# Patient Record
Sex: Female | Born: 1960
Health system: Southern US, Community
[De-identification: ages and names within clinical notes are randomized; demographics above are authoritative.]

## PROBLEM LIST (undated history)

## (undated) DIAGNOSIS — J45909 Unspecified asthma, uncomplicated: Secondary | ICD-10-CM

## (undated) DIAGNOSIS — T7840XA Allergy, unspecified, initial encounter: Secondary | ICD-10-CM

## (undated) HISTORY — DX: Unspecified asthma, uncomplicated: J45.909

## (undated) HISTORY — DX: Allergy, unspecified, initial encounter: T78.40XA

## (undated) HISTORY — PX: HYSTERECTOMY: SHX81

---

## 1999-05-13 HISTORY — PX: ABDOMINAL HYSTERECTOMY: SHX81

## 2014-02-16 ENCOUNTER — Other Ambulatory Visit (INDEPENDENT_AMBULATORY_CARE_PROVIDER_SITE_OTHER): Payer: Self-pay | Admitting: Family

## 2014-02-16 DIAGNOSIS — J328 Other chronic sinusitis: Secondary | ICD-10-CM

## 2014-02-22 ENCOUNTER — Ambulatory Visit (INDEPENDENT_AMBULATORY_CARE_PROVIDER_SITE_OTHER)
Admission: RE | Admit: 2014-02-22 | Discharge: 2014-02-22 | Disposition: A | Discharge status: Home / Self Care | Payer: No Typology Code available for payment source | Source: Ambulatory Visit | Attending: Family | Admitting: Family

## 2014-02-22 DIAGNOSIS — J328 Other chronic sinusitis: Secondary | ICD-10-CM

## 2014-02-27 ENCOUNTER — Other Ambulatory Visit (INDEPENDENT_AMBULATORY_CARE_PROVIDER_SITE_OTHER): Payer: Self-pay | Admitting: Otolaryngology

## 2014-02-27 DIAGNOSIS — J4520 Mild intermittent asthma, uncomplicated: Secondary | ICD-10-CM

## 2014-02-27 DIAGNOSIS — J324 Chronic pansinusitis: Secondary | ICD-10-CM

## 2014-02-27 DIAGNOSIS — J330 Polyp of nasal cavity: Secondary | ICD-10-CM

## 2014-04-10 ENCOUNTER — Ambulatory Visit (INDEPENDENT_AMBULATORY_CARE_PROVIDER_SITE_OTHER)
Admission: RE | Admit: 2014-04-10 | Discharge: 2014-04-10 | Disposition: A | Discharge status: Home / Self Care | Payer: No Typology Code available for payment source | Source: Ambulatory Visit | Attending: Otolaryngology | Admitting: Otolaryngology

## 2014-04-10 ENCOUNTER — Encounter (INDEPENDENT_AMBULATORY_CARE_PROVIDER_SITE_OTHER): Payer: Self-pay

## 2014-04-10 DIAGNOSIS — J324 Chronic pansinusitis: Secondary | ICD-10-CM

## 2014-04-10 DIAGNOSIS — J330 Polyp of nasal cavity: Secondary | ICD-10-CM

## 2014-04-10 DIAGNOSIS — J4520 Mild intermittent asthma, uncomplicated: Secondary | ICD-10-CM

## 2014-05-09 ENCOUNTER — Ambulatory Visit (INDEPENDENT_AMBULATORY_CARE_PROVIDER_SITE_OTHER): Payer: No Typology Code available for payment source | Admitting: Physician Assistant

## 2014-05-09 ENCOUNTER — Encounter (INDEPENDENT_AMBULATORY_CARE_PROVIDER_SITE_OTHER): Payer: Self-pay

## 2014-05-09 VITALS — BP 130/90 | HR 88 | Temp 98.1°F | Resp 16 | Ht 64.0 in | Wt 230.0 lb

## 2014-05-09 DIAGNOSIS — H10022 Other mucopurulent conjunctivitis, left eye: Secondary | ICD-10-CM

## 2014-05-09 DIAGNOSIS — H1032 Unspecified acute conjunctivitis, left eye: Secondary | ICD-10-CM

## 2014-05-09 MED ORDER — POLYMYXIN B-TRIMETHOPRIM 10000-0.1 UNIT/ML-% OP SOLN
1.0000 [drp] | Freq: Four times a day (QID) | OPHTHALMIC | Status: DC
Start: 2014-05-09 — End: 2015-05-01

## 2014-05-09 NOTE — Patient Instructions (Signed)
Conjunctivitis Caused by Infection  Infections are caused by viruses or germs (bacteria). Treatment includes keeping your eyes and hands clean. Your health care provider may prescribe eye drops, and tell you to stay home from work or school if you're contagious. Untreated infections can be serious. It'simportant to see yourprovider for a diagnosis.    Viral infections  A cold, flu, or other virus can spread to your eyes. This causes a watery discharge. Your eyes may burn or itch and get red. Your eyelids may also be puffy and sore.  Treatment  Most viral infections go away on their own. Artificial tears and warm compresses can relieve symptoms. Your provider may also prescribe eye drops. A viral infection can be very contagious and spreads quickly. To prevent this, wash your hands often. Use a separate tissue to wipe each eye. Don't touch your eyes or share bedding or towels.  Bacterial infections  Bacterial infections often occur in one eye. There may be a watery or a thick discharge from the eye. These infections can cause serious damage to your eye if not treated promptly.  Treatment  Your provider may prescribe eye drops or ointment to kill the bacteria. Warm compresses can help keep the eyelids clean. To keep the bacteria from spreading, wash your hands often. Use a separate tissue to wipe each eye. Don't touch your eyes or share bedding or towels.   2000-2015 [employer] [address], [place] [address]. All rights reserved. This information is not intended as a substitute for professional medical care. Always follow your healthcare professional's instructions.

## 2014-05-09 NOTE — Progress Notes (Signed)
Subjective:    Patient ID: Lisa Joseph is a 53 y.o. female.    Eye Problem   The left eye is affected. This is a new problem. The current episode started yesterday. The problem occurs constantly. The problem has been unchanged. There was no injury mechanism. The pain is at a severity of 2/10. The pain is mild. Associated symptoms include an eye discharge (white), eye redness, itching and a recent URI. Pertinent negatives include no blurred vision, double vision, fever, foreign body sensation, nausea, photophobia or vomiting. She has tried commercial eye wash for the symptoms. The treatment provided no relief.       The following portions of the patient's history were reviewed and updated as appropriate: allergies, current medications, past family history, past medical history, past social history, past surgical history and problem list.    Review of Systems   Constitutional: Negative for fever.   Eyes: Positive for discharge (white) and redness. Negative for blurred vision, double vision and photophobia.   Gastrointestinal: Negative for nausea and vomiting.   Skin: Positive for itching.   All other systems reviewed and negative.        Objective:    BP 130/90 mmHg  Pulse 88  Temp(Src) 98.1 F (36.7 C) (Oral)  Resp 16  Ht 1.626 m (5\' 4" )  Wt 104.327 kg (230 lb)  BMI 39.46 kg/m2    Physical Exam      Constitutional: oriented to person, place, and time. Appears well-developed and well-nourished.   HENT:   Head: Normocephalic and atraumatic.   Right Ear: External ear and tympanic membrane are normal.  Left Ear: External ear and tympanic membrane are normal.   Nose: Nose normal without drainage.  Mouth/Throat: Oropharynx is clear and moist without erythema.    Right Eye: Normal conjunctiva without drainage, EOM's intact without nystagmus  Left Eye: Conjunctival injection, white discharge. No uptake of fluorescein dye.  Pulmonary/Chest: Effort normal. Breath sounds are clear bilaterally throughout. Normal heart  rate, rhythm, and heart sounds without murmur.  Musculoskeletal: Normal range of motion without pain.    Neurological: The patient is alert and oriented to person, place, and time.   Skin: Skin is warm and dry.   Psychiatric:  Normal mood and affect.     Assessment and Plan:       Lisa Joseph was seen today for eye problem.    Diagnoses and all orders for this visit:    Acute bacterial conjunctivitis of left eye  Orders:  -     trimethoprim-polymyxin b (POLYTRIM) ophthalmic solution; Place 1 drop into the left eye every 6 (six) hours.  x7 days  Counseled to finish course of antibiotics even if condition resolves prior to duration of treatment, to help prevent recurrence and antibiotic resistance.   Pt education handout as below.  Patient Instructions     Conjunctivitis Caused by Infection  Infections are caused by viruses or germs (bacteria). Treatment includes keeping your eyes and hands clean. Your health care provider may prescribe eye drops, and tell you to stay home from work or school if you're contagious. Untreated infections can be serious. It'simportant to see yourprovider for a diagnosis.    Viral infections  A cold, flu, or other virus can spread to your eyes. This causes a watery discharge. Your eyes may burn or itch and get red. Your eyelids may also be puffy and sore.  Treatment  Most viral infections go away on their own. Artificial tears and warm compresses  can relieve symptoms. Your provider may also prescribe eye drops. A viral infection can be very contagious and spreads quickly. To prevent this, wash your hands often. Use a separate tissue to wipe each eye. Don't touch your eyes or share bedding or towels.  Bacterial infections  Bacterial infections often occur in one eye. There may be a watery or a thick discharge from the eye. These infections can cause serious damage to your eye if not treated promptly.  Treatment  Your provider may prescribe eye drops or ointment to kill the bacteria. Warm  compresses can help keep the eyelids clean. To keep the bacteria from spreading, wash your hands often. Use a separate tissue to wipe each eye. Don't touch your eyes or share bedding or towels.   [address] [employer] [address] [place], [place] [address]. All rights reserved. This information is not intended as a substitute for professional medical care. Always follow your healthcare professional's instructions.            Patient/guardian expressed understanding and agreement with plan of care at time of discharge.   RTC if worsening or persistence.          Kandis Mannan, PA-C  [facility]  05/09/2014  1:41 PM

## 2014-05-12 HISTORY — PX: NASAL SINUS SURGERY: SHX719

## 2014-08-11 HISTORY — PX: SINUS SURGERY: SHX187

## 2014-10-25 ENCOUNTER — Ambulatory Visit (INDEPENDENT_AMBULATORY_CARE_PROVIDER_SITE_OTHER): Payer: BC Managed Care – PPO | Admitting: Orthopaedic Surgery

## 2014-10-25 ENCOUNTER — Encounter (INDEPENDENT_AMBULATORY_CARE_PROVIDER_SITE_OTHER): Payer: Self-pay | Admitting: Orthopaedic Surgery

## 2014-10-25 VITALS — BP 167/91 | HR 83 | Ht 64.0 in | Wt 275.0 lb

## 2014-10-25 DIAGNOSIS — M1711 Unilateral primary osteoarthritis, right knee: Secondary | ICD-10-CM | POA: Insufficient documentation

## 2014-10-25 MED ORDER — BETAMETHASONE SOD PHOS & ACET 6 (3-3) MG/ML IJ SUSP
12.0000 mg | Freq: Once | INTRAMUSCULAR | Status: DC
Start: ? — End: 2014-10-25
  Administered 2014-10-25: 10:00:00 12 mg via INTRA_ARTICULAR

## 2014-10-25 MED ORDER — LIDOCAINE HCL (PF) 1 % IJ SOLN
2.0000 mL | Freq: Once | INTRAMUSCULAR | Status: DC
Start: ? — End: 2014-10-25
  Administered 2014-10-25: 2 mL via INTRA_ARTICULAR

## 2014-10-25 NOTE — Progress Notes (Signed)
Progress Note      Chief Complaint   Patient presents with   . Knee Pain     right       Subjective/HPI Comments:    Patient is a  54 y.o. female who presents with right medial knee pain.  She was walking a lot in [place] in February and has had knee pain since the day after.  She lives in a 3 story townhouse, and has difficulty going up the stairs.  When she sits for long periods, she has to stand for a while before she can put weight on it.      Date of Injury:na  Date of Surgery:na  Date Last Seen:new    Dominant Side:right    Pain level today on a scale of 0-10, with 10 being the highest, is 2, gets 5    Pain Quality:    _x__ Aching    ___ Burning    ___ Shooting    ___ Stabbing    _x__ Other: sharp    Pain Course:       ___ Constant    ___ Intermittent    ___ Fluctuating    ___ Improving    ___ Worsening    Associated Symptoms:        ___ Numbness      ___ Weakness      ___ Decreased Range of Motion      ___ Stiffness      ___ Pain with Activity      ___ Night Pain      ___ Locking and Catching      ___ Other:    Previous Treatment:      ___ Rest      ___ Ice      ___ Heat      ___ Tylenol      ___ Anti-inflammatory medication       ___ Other:    Patient lives with husband  Patient works at [employer]house [employer], at [employer]    Outpatient Prescriptions Marked as Taking for the 10/25/14 encounter (Office Visit) with Berniece Pap, MD   Medication Sig Dispense Refill   . acetaminophen (TYLENOL) 325 MG tablet Take 650 mg by mouth.     Elwin Sleight 200-5 MCG/ACT Aerosol   0       Review of Systems:  Review of Systems   Constitutional: Positive for activity change.   HENT: Negative for hearing loss.    Eyes: Negative for visual disturbance.   Respiratory: Negative for shortness of breath.    Cardiovascular: Negative for chest pain, palpitations and leg swelling.   Gastrointestinal: Negative for abdominal pain and diarrhea.   Genitourinary: Negative for urgency.   Musculoskeletal: Positive for gait problem. Negative for joint  swelling.        Positive for right knee pain   Neurological: Negative for seizures, syncope and headaches.   Psychiatric/Behavioral: Negative for dysphoric mood. The patient is not nervous/anxious.           Information above has been gathered by office staff and reviewed and amended by me as needed.  The following portions of the patient's history were reviewed and updated as appropriate: allergies, current medications, past family history, past medical history, past social history, past surgical history, and problem list.    Objective/ Physical Exam:    BP 167/91 mmHg  Pulse 83  Ht 1.626 m (5\' 4" )  Wt 124.739 kg (275 lb)  BMI 47.18 kg/m2  Patient  is alert and oriented.   The skin at the affected area is intact.    Right Knee Exam   Swelling: None  Effusion: No    Tenderness   The patient is experiencing tenderness in the medial joint line.    Range of Motion   Extension: 0  Flexion:     120    Tests   McMurrays:  Medial - Negative      Lateral - Negative  Lachman:  Anterior - Negative    Posterior - Negative  Drawer:       Anterior - Negative    Posterior - Negative  Varus:  Negative  Valgus: Negative    Comments:  6 valgus  Varus is stable but painful      Test Results:      X ray taken today, 10-25-14, for the right knee, shows a smooth joint with early squarring off of the femur but a decent joint space.  Interestingly, the bilateral views which allow comparison with the opposite knee indicate that the left Nonsymptomatic knee is actually a bit worse than the right    Assessment:    1. Primary osteoarthritis of right knee        Plan:    Does not tolerate NSAIDs, gets respiratory distress    Knee Injection Note    Patient's name and date of birth were verified, site of injection was verified, verified that all of the correct equipment was in the room, and consent was obtained.  A "Time Out" was performed to confirm the above.  The right knee was prepped with betadine.  2 mL of Lidocaine and 12mg  of  Betamethasone was injected in the lateral supra-patellar pouch, with aspiration of a drop of joint fluid to confirm needle placement in the joint under sterile technique, with no complications.    The patient has been informed of all ordered tests and/or consults, if applicable.  All patient concerns and questions have been addressed.    Work Status: working   Follow up:  prn

## 2014-10-25 NOTE — Patient Instructions (Signed)
Reason for blood pressure information below:  Your Blood Pressure Reading Today was Elevated-You should see your primary care physician as soon as possible.    When Is Your Blood Pressure a Concern?    If your blood pressure is high, you need to lower it and keep it under control. Your blood pressure reading has 2 numbers. One or both of these numbers can be too high.    The top number is called the systolic blood pressure. This reading is too high if it is 140 or higher.    The bottom number is called the diastolic blood pressure. It is too high if it is 90 or higher.    You are more likely to have high blood pressure as you get older. This is because your blood vessels become stiffer as you age. When that happens, your blood pressure goes up. High blood pressure can lead to stroke, heart attack, heart failure, kidney disease, and early death.    If you have heart or kidney problems, diabetes, or if you had a stroke, your doctor may want your blood pressure to be even lower than people who do not have these conditions.    Medications for Blood Pressure    Many medicines can help you control your blood pressure. Your health care provider will prescribe the best medicine for you. Your health care provider will also monitor your medicines and make changes if you need them.    Diet, Exercise, and Other Lifestyle Changes    In addition to taking medicine, you can do many things to help control your blood pressure.    Limit the amount of sodium (salt) you eat. Aim for less than 1,500 mg per day. Limit how much alcohol you drink -- 1 drink a day for women, 2 a day for men.    Eat a heart-healthy diet. Include potassium and fiber, and drink plenty of water. Stay at a healthy body weight. Find a weight-loss program to help you, if you need it.    Exercise regularly -- at least 30 minutes a day of moderate aerobic exercise.    Reduce stress. Try to avoid things that cause you stress. You can also try meditation or  yoga.    If you smoke, quit. Find a program that will help you stop.    Your doctor can help you find programs for losing weight, stopping smoking, and exercising. You can also get a referral from your doctor to a dietitian. The dietitian can help you plan a diet that is healthy for you.  Checking Your Blood Pressure     Your doctor may ask you to keep track of your blood pressure at home. Make sure you get a good quality, well-fitting home device. It is best to have one with a cuff for your arm and a digital readout. Practice with your health care provider to make sure you are taking your blood pressure correctly.     It is normal for your blood pressure to be different at different times of the day.    It is usually higher when you are at work. It drops slightly when you are at home. It is usually lowest when you are sleeping.    It is normal for your blood pressure to increase suddenly when you wake up. In people with very high blood pressure, this is when they are most at risk for heart attack and stroke.    Follow-up     Your doctor   will give you a physical exam and check your blood pressure often. With your doctor, establish a goal for your blood pressure.    If you monitor your blood pressure at home, keep a written record. Bring the results to your clinic visit. Your doctor or nurse may ask you these questions. Having a written record will make them easy to answer:    . What was your most recent blood pressure reading?  . What was the blood pressure reading before that one?  . What is the average systolic (top) number and average diastolic (bottom) number?  . Has your blood pressure increased recently?     When to Call the Doctor    Call your doctor if your blood pressure goes well above your normal range.    Also call your doctor if you have any of these symptoms:  . Severe headache  . Irregular heartbeat or pulse  . Chest pain  . Sweating, nausea, or vomiting  . Shortness of breath  . Dizziness or  lightheadedness  . Pain or tingling in the neck, jaw, shoulder, or arms  . Numbness or weakness in your body  . Fainting  . Trouble seeing  . Confusion  . Difficulty speaking  . Other side effects that you think might be from your medicine or your blood pressure   .

## 2015-05-01 ENCOUNTER — Encounter (INDEPENDENT_AMBULATORY_CARE_PROVIDER_SITE_OTHER): Payer: Self-pay | Admitting: Orthopaedic Surgery

## 2015-05-01 ENCOUNTER — Ambulatory Visit (INDEPENDENT_AMBULATORY_CARE_PROVIDER_SITE_OTHER): Payer: BC Managed Care – PPO | Admitting: Orthopaedic Surgery

## 2015-05-01 ENCOUNTER — Telehealth (INDEPENDENT_AMBULATORY_CARE_PROVIDER_SITE_OTHER): Payer: Self-pay | Admitting: Orthopaedic Surgery

## 2015-05-01 VITALS — BP 159/98 | HR 96 | Ht 64.0 in | Wt 276.4 lb

## 2015-05-01 DIAGNOSIS — M1711 Unilateral primary osteoarthritis, right knee: Secondary | ICD-10-CM

## 2015-05-01 MED ORDER — HYALURONAN 30 MG/2ML IX SOSY
1.0000 | PREFILLED_SYRINGE | INTRA_ARTICULAR | Status: DC
Start: ? — End: 2015-05-01

## 2015-05-01 NOTE — Telephone Encounter (Signed)
Pt called back and stated her insurance denied orthovisc. Pt wants to know what her options are

## 2015-05-01 NOTE — Progress Notes (Signed)
Progress Note      Chief Complaint   Patient presents with   . Knee Pain     right       Subjective/HPI Comments:    Patient is a  54 y.o. female who presents with right knee pain.  She had an injection of 2 mL of Lidocaine and 12mg  of Betamethasone was injected in the lateral supra-patellar pouch, right knee on 10/25/14 and states she got relief for approximately two weeks and then the pain returned.  She says the pain is getting worse and she is more painful with a lot of walking.  When she goes from sitting to standing she has to wait a few minutes before she starts to walk.  She has not noticed any swelling.    She went to the grocery store on Saturday and had pain for two days following, she had to take Vicodin for the pain that she had left from sinus surgery.  Today is a better day for the pain she says.     Date of Injury:na  Date of Surgery:na  Date Last Seen:10/25/14    Dominant Side:right        Pain level today on a scale of 0-10, with 10 being the highest, is 3.    Patient lives with husband  Patient works at [employer]house [employer], at [employer] site    Outpatient Prescriptions Marked as Taking for the 05/01/15 encounter (Office Visit) with Berniece Pap, MD   Medication Sig Dispense Refill   . acetaminophen (TYLENOL) 325 MG tablet Take 650 mg by mouth.     . DULERA 200-5 MCG/ACT Aerosol   0   . Mometasone Furo-Formoterol Fum 100-5 MCG/ACT Aerosol Inhale into the lungs.         Review of Systems:  Constitutional: Positive for activity change.   HENT: Negative for hearing loss.    Eyes: Negative for visual disturbance.   Respiratory: Negative for shortness of breath.    Cardiovascular: Negative for chest pain, palpitations and leg swelling.   Gastrointestinal: Negative for abdominal pain and diarrhea.   Genitourinary: Negative for urgency.   Musculoskeletal: Positive for gait problem. Negative for joint swelling.        Positive for right knee pain   Neurological: Negative for seizures, syncope and headaches.    Psychiatric/Behavioral: Negative for dysphoric mood. The patient is not nervous/anxious.        Information above has been gathered by office staff and reviewed and amended by me as needed.  The following portions of the patient's history were reviewed and updated as appropriate: allergies, current medications, past family history, past medical history, past social history, past surgical history, and problem list.    Objective/ Physical Exam:    BP 159/98 mmHg  Pulse 96  Ht 1.626 m (5\' 4" )  Wt 125.374 kg (276 lb 6.4 oz)  BMI 47.42 kg/m2  Patient is alert and oriented.   The skin at the affected area is intact  ROM 0-100  Tender at the medial joint line.      Assessment:    1. Primary osteoarthritis of right knee        Plan:    orthovisc prescribed  The patient has been informed of all ordered tests and/or consults, if applicable.  All patient concerns and questions have been addressed.    Work Status: working   Follow up: when [employer] is available

## 2015-05-01 NOTE — Patient Instructions (Signed)
Reason for blood pressure information below:  Your Blood Pressure Reading Today was Elevated-You should see your primary care physician as soon as possible.    When Is Your Blood Pressure a Concern?    If your blood pressure is high, you need to lower it and keep it under control. Your blood pressure reading has 2 numbers. One or both of these numbers can be too high.    The top number is called the systolic blood pressure. This reading is too high if it is 140 or higher.    The bottom number is called the diastolic blood pressure. It is too high if it is 90 or higher.    You are more likely to have high blood pressure as you get older. This is because your blood vessels become stiffer as you age. When that happens, your blood pressure goes up. High blood pressure can lead to stroke, heart attack, heart failure, kidney disease, and early death.    If you have heart or kidney problems, diabetes, or if you had a stroke, your doctor may want your blood pressure to be even lower than people who do not have these conditions.    Medications for Blood Pressure    Many medicines can help you control your blood pressure. Your health care provider will prescribe the best medicine for you. Your health care provider will also monitor your medicines and make changes if you need them.    Diet, Exercise, and Other Lifestyle Changes    In addition to taking medicine, you can do many things to help control your blood pressure.    Limit the amount of sodium (salt) you eat. Aim for less than 1,500 mg per day. Limit how much alcohol you drink -- 1 drink a day for women, 2 a day for men.    Eat a heart-healthy diet. Include potassium and fiber, and drink plenty of water. Stay at a healthy body weight. Find a weight-loss program to help you, if you need it.    Exercise regularly -- at least 30 minutes a day of moderate aerobic exercise.    Reduce stress. Try to avoid things that cause you stress. You can also try meditation or  yoga.    If you smoke, quit. Find a program that will help you stop.    Your doctor can help you find programs for losing weight, stopping smoking, and exercising. You can also get a referral from your doctor to a dietitian. The dietitian can help you plan a diet that is healthy for you.  Checking Your Blood Pressure     Your doctor may ask you to keep track of your blood pressure at home. Make sure you get a good quality, well-fitting home device. It is best to have one with a cuff for your arm and a digital readout. Practice with your health care provider to make sure you are taking your blood pressure correctly.     It is normal for your blood pressure to be different at different times of the day.    It is usually higher when you are at work. It drops slightly when you are at home. It is usually lowest when you are sleeping.    It is normal for your blood pressure to increase suddenly when you wake up. In people with very high blood pressure, this is when they are most at risk for heart attack and stroke.    Follow-up     Your doctor   will give you a physical exam and check your blood pressure often. With your doctor, establish a goal for your blood pressure.    If you monitor your blood pressure at home, keep a written record. Bring the results to your clinic visit. Your doctor or nurse may ask you these questions. Having a written record will make them easy to answer:    . What was your most recent blood pressure reading?  . What was the blood pressure reading before that one?  . What is the average systolic (top) number and average diastolic (bottom) number?  . Has your blood pressure increased recently?     When to Call the Doctor    Call your doctor if your blood pressure goes well above your normal range.    Also call your doctor if you have any of these symptoms:  . Severe headache  . Irregular heartbeat or pulse  . Chest pain  . Sweating, nausea, or vomiting  . Shortness of breath  . Dizziness or  lightheadedness  . Pain or tingling in the neck, jaw, shoulder, or arms  . Numbness or weakness in your body  . Fainting  . Trouble seeing  . Confusion  . Difficulty speaking  . Other side effects that you think might be from your medicine or your blood pressure   .

## 2015-05-02 NOTE — Telephone Encounter (Signed)
Therapy might yield temporary relief.  I will order it if she wants to try it.

## 2015-05-03 NOTE — Telephone Encounter (Signed)
lmom for pt to call back

## 2015-05-03 NOTE — Telephone Encounter (Signed)
Pt states that she would like to try therapy and would like to attend downstairs.  After dr zimet write referral, please take downstairs for them to make an appt

## 2015-05-04 NOTE — Telephone Encounter (Signed)
Order taken downstairs

## 2015-05-04 NOTE — Telephone Encounter (Signed)
PT order written

## 2015-05-15 ENCOUNTER — Ambulatory Visit
Admission: RE | Admit: 2015-05-15 | Discharge: 2015-05-15 | Disposition: A | Discharge status: Home / Self Care | Payer: BC Managed Care – PPO | Source: Ambulatory Visit | Attending: Orthopaedic Surgery | Admitting: Orthopaedic Surgery

## 2015-05-15 DIAGNOSIS — M1711 Unilateral primary osteoarthritis, right knee: Secondary | ICD-10-CM | POA: Insufficient documentation

## 2015-06-13 ENCOUNTER — Ambulatory Visit
Admission: RE | Admit: 2015-06-13 | Discharge: 2015-06-13 | Disposition: A | Discharge status: Home / Self Care | Payer: BC Managed Care – PPO | Source: Ambulatory Visit | Attending: Orthopaedic Surgery | Admitting: Orthopaedic Surgery

## 2015-06-13 DIAGNOSIS — M1711 Unilateral primary osteoarthritis, right knee: Secondary | ICD-10-CM | POA: Insufficient documentation

## 2015-07-11 ENCOUNTER — Ambulatory Visit: Payer: BC Managed Care – PPO

## 2015-10-22 ENCOUNTER — Encounter (INDEPENDENT_AMBULATORY_CARE_PROVIDER_SITE_OTHER): Payer: Self-pay | Admitting: Family Medicine

## 2015-10-22 ENCOUNTER — Ambulatory Visit (INDEPENDENT_AMBULATORY_CARE_PROVIDER_SITE_OTHER): Payer: BC Managed Care – PPO | Admitting: Family Medicine

## 2015-10-22 VITALS — BP 148/82 | HR 95 | Temp 97.8°F | Resp 16 | Ht 64.0 in | Wt 270.0 lb

## 2015-10-22 DIAGNOSIS — J0140 Acute pansinusitis, unspecified: Secondary | ICD-10-CM

## 2015-10-22 MED ORDER — DOXYCYCLINE HYCLATE 100 MG PO CAPS
100.0000 mg | ORAL_CAPSULE | Freq: Two times a day (BID) | ORAL | Status: AC
Start: 2015-10-22 — End: 2015-10-29

## 2015-10-22 NOTE — Progress Notes (Signed)
Subjective:    Patient ID: Lisa Joseph is a 55 y.o. female.    Sinus Problem  This is a new problem. The current episode started in the past 7 days (10 days ). The problem has been gradually worsening since onset. There has been no fever. The pain is moderate. Associated symptoms include chills, congestion, coughing, ear pain, headaches, sinus pressure and a sore throat. Past treatments include acetaminophen. The treatment provided mild relief.       The following portions of the patient's history were reviewed and updated as appropriate: allergies, current medications, past medical history, past social history, past surgical history and problem list.    Review of Systems   Constitutional: Positive for chills.   HENT: Positive for congestion, ear pain, sinus pressure and sore throat.    Respiratory: Positive for cough.    Neurological: Positive for headaches.   All other systems reviewed and are negative.        Objective:    BP 148/82 mmHg  Pulse 95  Temp(Src) 97.8 F (36.6 C) (Oral)  Resp 16  Ht 1.626 m (5\' 4" )  Wt 122.471 kg (270 lb)  BMI 46.32 kg/m2    BP elevated; reviewed. No indication for urgent management.    Physical Exam   Constitutional: She is oriented to person, place, and time. She appears well-developed and well-nourished. No distress.   HENT:   Head: Normocephalic and atraumatic.   Right Ear: Tympanic membrane, external ear and ear canal normal.   Left Ear: Tympanic membrane, external ear and ear canal normal.   Nose: Mucosal edema present.   Mouth/Throat: Uvula is midline, oropharynx is clear and moist and mucous membranes are normal.   Eyes: Conjunctivae and EOM are normal.   Neck: Normal range of motion. Neck supple.   Cardiovascular: Normal rate and regular rhythm.    Pulmonary/Chest: Effort normal and breath sounds normal. No respiratory distress. She has no wheezes. She has no rales.   Musculoskeletal: Normal range of motion.   Lymphadenopathy:     She has no cervical adenopathy.    Neurological: She is alert and oriented to person, place, and time.   Skin: Skin is warm and dry. She is not diaphoretic.   Psychiatric: She has a normal mood and affect.   Nursing note and vitals reviewed.        Assessment and Plan:       [name] was seen today for sinus problem.    Diagnoses and all orders for this visit:    Acute non-recurrent pansinusitis  -     doxycycline (VIBRAMYCIN) 100 MG capsule; Take 1 capsule (100 mg total) by mouth 2 (two) times daily. (allergy to pens)   Advised rest and fluids; discussed appropriate otc sx tx for use prn.   Follow up with PCP or RTC if there are any new or worsening symptoms or if the symptoms are lasting longer than expected.  Patient/guardian expressed understanding and agreement with plan of care at time of discharge.               Isaiah Blakes, MD  [facility]  10/22/2015  2:39 PM

## 2015-10-22 NOTE — Patient Instructions (Signed)
Sinusitis (Antibiotic Treatment)    The sinuses are air-filled spaces within the bones of the face. They connect to the inside of the nose.Sinusitisis an inflammation of the tissue lining the sinus cavity. Sinus inflammation can occur during a cold. It can also be due to allergies to pollens and other particles in the air. Sinusitis can cause symptoms of sinus congestion and fullness. A sinus infection causes fever, headache and facial pain. There is often green or yellow drainage from the nose or into the back of the throat (post-nasal drip). You have been given antibiotics to treat this condition.  Home care:   Take the full course of antibiotics as instructed. Do not stop taking them, even if you feel better.   Drink plenty of water, hot tea, and other liquids. This may help thin mucus. It also may promote sinus drainage.   Heat may help soothe painful areas of the face. Use a towel soaked in hot water. Or, stand in the shower and direct the hot spray onto your face. Using a vaporizer along with a menthol rub at night may also help.   Anexpectorantcontaining guaifenesin may help thin the mucus and promote drainage from the sinuses.   Over-the-counterdecongestantsmay be used unless a similar medicine was prescribed. Nasal sprays work the fastest. Use one that contains phenylephrine or oxymetazoline. First blow the nose gently. Then use the spray. Do not use these medicines more often than directed on the label or symptoms may get worse. You may also use tablets containing pseudoephedrine. Avoid products that combine ingredients, because side effects may be increased. Read labels. You can also ask the pharmacist for help. (NOTE:Persons with high blood pressure should not use decongestants. They can raise blood pressure.)   Over-the-counterantihistaminesmay help if allergies contributed to your sinusitis.    Do not use nasal rinses or irrigation during an acute sinus infection, unless told to by  your health care provider. Rinsing may spread the infection to other sinuses.   Use acetaminophen or ibuprofen to control pain, unless another pain medicine was prescribed. (If you have chronic liver or kidney disease or ever had a stomach ulcer, talk with your doctor before using these medicines. Aspirin should never be used in anyone under 18 years of age who is ill with a fever. It may cause severe liver damage.)   Don't smoke. This can worsen symptoms.  Follow-up care  Follow up with your healthcare provider or our staff if you are not improving within the next week.  When to seek medical advice  Call your healthcare provider if any of these occur:   Facial pain or headache becoming more severe   Stiff neck   Unusual drowsiness or confusion   Swelling of the forehead or eyelids   Vision problems, including blurred or double vision   Fever of100.4F (38C)or higher, or as directed by your healthcare provider   Seizure   Breathing problems   Symptoms not resolving within 10 days  Date Last Reviewed: 08/22/2013   2000-2016 [employer] [address], [place] [address]. All rights reserved. This information is not intended as a substitute for professional medical care. Always follow your healthcare professional's instructions.

## 2015-10-25 ENCOUNTER — Telehealth (INDEPENDENT_AMBULATORY_CARE_PROVIDER_SITE_OTHER): Payer: Self-pay

## 2015-10-25 NOTE — Telephone Encounter (Signed)
Called to check on patient after recent visit.    Feeling much better

## 2016-05-19 ENCOUNTER — Encounter: Payer: Self-pay | Admitting: Internal Medicine

## 2016-05-19 ENCOUNTER — Ambulatory Visit (INDEPENDENT_AMBULATORY_CARE_PROVIDER_SITE_OTHER): Payer: BLUE CROSS/BLUE SHIELD | Admitting: Internal Medicine

## 2016-05-19 VITALS — BP 128/92 | HR 79 | Temp 97.9°F | Ht 63.5 in | Wt 284.8 lb

## 2016-05-19 DIAGNOSIS — I10 Essential (primary) hypertension: Secondary | ICD-10-CM | POA: Diagnosis not present

## 2016-05-19 DIAGNOSIS — J4530 Mild persistent asthma, uncomplicated: Secondary | ICD-10-CM | POA: Diagnosis not present

## 2016-05-19 MED ORDER — LOSARTAN POTASSIUM 25 MG PO TABS
25.0000 mg | ORAL_TABLET | Freq: Every day | ORAL | 0 refills | Status: DC
Start: 2016-05-19 — End: 2016-06-03

## 2016-05-19 MED ORDER — DULERA 200-5 MCG/ACT IN AERO: 2.0000 | INHALATION_SPRAY | Freq: Two times a day (BID) | RESPIRATORY_TRACT | 1 refills | Status: DC

## 2016-05-19 NOTE — Assessment & Plan Note (Signed)
Controlled on Dulera Refilled today

## 2016-05-19 NOTE — Patient Instructions (Signed)
Hypertension Hypertension, commonly called high blood pressure, is when the force of blood pumping through your arteries is too strong. Your arteries are the blood vessels that carry blood from your heart throughout your body. A blood pressure reading consists of a higher number over a lower number, such as 110/72. The higher number (systolic) is the pressure inside your arteries when your heart pumps. The lower number (diastolic) is the pressure inside your arteries when your heart relaxes. Ideally you want your blood pressure below 120/80. Hypertension forces your heart to work harder to pump blood. Your arteries may become narrow or stiff. Having untreated or uncontrolled hypertension can cause heart attack, stroke, kidney disease, and other problems. What increases the risk? Some risk factors for high blood pressure are controllable. Others are not. Risk factors you cannot control include:  Race. You may be at higher risk if you are African American.  Age. Risk increases with age.  Gender. Men are at higher risk than women before age 45 years. After age 65, women are at higher risk than men. Risk factors you can control include:  Not getting enough exercise or physical activity.  Being overweight.  Getting too much fat, sugar, calories, or salt in your diet.  Drinking too much alcohol. What are the signs or symptoms? Hypertension does not usually cause signs or symptoms. Extremely high blood pressure (hypertensive crisis) may cause headache, anxiety, shortness of breath, and nosebleed. How is this diagnosed? To check if you have hypertension, your health care provider will measure your blood pressure while you are seated, with your arm held at the level of your heart. It should be measured at least twice using the same arm. Certain conditions can cause a difference in blood pressure between your right and left arms. A blood pressure reading that is higher than normal on one occasion does  not mean that you need treatment. If it is not clear whether you have high blood pressure, you may be asked to return on a different day to have your blood pressure checked again. Or, you may be asked to monitor your blood pressure at home for 1 or more weeks. How is this treated? Treating high blood pressure includes making lifestyle changes and possibly taking medicine. Living a healthy lifestyle can help lower high blood pressure. You may need to change some of your habits. Lifestyle changes may include:  Following the DASH diet. This diet is high in fruits, vegetables, and whole grains. It is low in salt, red meat, and added sugars.  Keep your sodium intake below 2,300 mg per day.  Getting at least 30-45 minutes of aerobic exercise at least 4 times per week.  Losing weight if necessary.  Not smoking.  Limiting alcoholic beverages.  Learning ways to reduce stress. Your health care provider may prescribe medicine if lifestyle changes are not enough to get your blood pressure under control, and if one of the following is true:  You are 18-59 years of age and your systolic blood pressure is above 140.  You are 60 years of age or older, and your systolic blood pressure is above 150.  Your diastolic blood pressure is above 90.  You have diabetes, and your systolic blood pressure is over 140 or your diastolic blood pressure is over 90.  You have kidney disease and your blood pressure is above 140/90.  You have heart disease and your blood pressure is above 140/90. Your personal target blood pressure may vary depending on your medical   conditions, your age, and other factors. Follow these instructions at home:  Have your blood pressure rechecked as directed by your health care provider.  Take medicines only as directed by your health care provider. Follow the directions carefully. Blood pressure medicines must be taken as prescribed. The medicine does not work as well when you skip  doses. Skipping doses also puts you at risk for problems.  Do not smoke.  Monitor your blood pressure at home as directed by your health care provider. Contact a health care provider if:  You think you are having a reaction to medicines taken.  You have recurrent headaches or feel dizzy.  You have swelling in your ankles.  You have trouble with your vision. Get help right away if:  You develop a severe headache or confusion.  You have unusual weakness, numbness, or feel faint.  You have severe chest or abdominal pain.  You vomit repeatedly.  You have trouble breathing. This information is not intended to replace advice given to you by your health care provider. Make sure you discuss any questions you have with your health care provider. Document Released: 04/28/2005 Document Revised: 10/04/2015 Document Reviewed: 02/18/2013 Elsevier Interactive Patient Education  2017 Elsevier Inc.  

## 2016-05-19 NOTE — Progress Notes (Signed)
Pre visit review using our clinic review tool, if applicable. No additional management support is needed unless otherwise documented below in the visit note. 

## 2016-05-19 NOTE — Assessment & Plan Note (Signed)
Will start Losartan 25 mg daily Discussed how weight loss could help reduce her blood pressure  RTC in 2 weeks for annual exam/HTN followup

## 2016-05-19 NOTE — Progress Notes (Signed)
HPI  Pt presents to the clinic today to establish care and for management of the conditions listed below. She has not had a PCP in many years.  Seasonal Allergies: Worse in the spring. She reports it is triggered by ragweed. She does not take any antihistamine OTC.  Asthma, Mild Persistent: Triggered by cold weather and smoke. She takes [facility] daily as prescribed. She is requesting a refill of this today.  HTN: She reports her BP normally runs 120-130/90's. She has never been on BP medication before. She denies headaches, dizziness, chest pain or shortness of breath.  Flu: 01/2014 Tetanus: > 10 years ago Pneumovax: never Pap Smear: > 5 years ago, partial hysterectomy around 2003 Mammogram: > 2 years ago Colon Screening: never Vision Screening: annually Dentist: biannually  Past Medical History:  Diagnosis Date  . Allergy   . Asthma     Current Outpatient Prescriptions  Medication Sig Dispense Refill  . DULERA 200-5 MCG/ACT AERO INHALE 1 PUFF TWICE A DAY. **USE REGULARLY AND RINSE MOUTH AFTER USE**  1   No current facility-administered medications for this visit.     Allergies  Allergen Reactions  . Penicillins     Other reaction(s): Respiratory Distress  . Aspirin     Other reaction(s): Respiratory Distress    Family History  Problem Relation Age of Onset  . Arthritis Mother   . Hypertension Mother   . Breast cancer Sister   . Arthritis Paternal Grandmother   . Breast cancer Paternal Grandmother   . Hypertension Paternal Grandmother     Social History   Social History  . Marital status: Married    Spouse name: N/A  . Number of children: N/A  . Years of education: N/A   Occupational History  . Not on file.   Social History Main Topics  . Smoking status: Never Smoker  . Smokeless tobacco: Never Used  . Alcohol use Yes     Comment: rare  . Drug use:   . Sexual activity: Not on file   Other Topics Concern  . Not on file   Social History Narrative  .  No narrative on file    ROS:  Constitutional: Denies fever, malaise, fatigue, headache or abrupt weight changes.  HEENT: Denies eye pain, eye redness, ear pain, ringing in the ears, wax buildup, runny nose, nasal congestion, bloody nose, or sore throat. Respiratory: Denies difficulty breathing, shortness of breath, cough or sputum production.   Cardiovascular: Denies chest pain, chest tightness, palpitations or swelling in the hands or feet.   Neurological: Denies dizziness, difficulty with memory, difficulty with speech or problems with balance and coordination.    No other specific complaints in a complete review of systems (except as listed in HPI above).  PE:  BP (!) 128/92   Pulse 79   Temp 97.9 F (36.6 C) (Oral)   Ht 5' 3.5" (1.613 m)   Wt 284 lb 12 oz (129.2 kg)   SpO2 98%   BMI 49.65 kg/m  Wt Readings from Last 3 Encounters:  05/19/16 284 lb 12 oz (129.2 kg)    General: Appears her stated age, obese in NAD. HEENT: Head: normal shape and size; Eyes: sclera white, no icterus, conjunctiva pink, PERRLA and EOMs intact; Ears: Tm's gray and intact, normal light reflex;Throat/Mouth: Teeth present, mucosa pink and moist, no lesions or ulcerations noted.  Cardiovascular: Normal rate and rhythm. S1,S2 noted.  No murmur, rubs or gallops noted.  Pulmonary/Chest: Normal effort and positive vesicular breath sounds.  No respiratory distress. No wheezes, rales or ronchi noted.  Neurological: Alert and oriented.  Psychiatric: Mood and affect normal. Behavior is normal. Judgment and thought content normal.   Assessment and Plan:

## 2016-05-22 ENCOUNTER — Telehealth: Payer: Self-pay

## 2016-05-22 MED ORDER — DULERA 200-5 MCG/ACT IN AERO: 1.0000 | INHALATION_SPRAY | Freq: Two times a day (BID) | RESPIRATORY_TRACT | 1 refills | Status: DC

## 2016-05-22 NOTE — Telephone Encounter (Signed)
Rx sent through e-scribe  

## 2016-05-22 NOTE — Telephone Encounter (Signed)
[facility] [place] [facility]; there are 2 sets of instructions.Please advise.

## 2016-05-22 NOTE — Addendum Note (Signed)
Addended by: Roena MaladyEVONTENNO, Pat Elicker Y on: 05/22/2016 12:23 PM   Modules accepted: Orders

## 2016-05-22 NOTE — Telephone Encounter (Signed)
I puff BID

## 2016-06-02 ENCOUNTER — Encounter: Payer: Self-pay | Admitting: Internal Medicine

## 2016-06-02 ENCOUNTER — Ambulatory Visit (INDEPENDENT_AMBULATORY_CARE_PROVIDER_SITE_OTHER): Payer: BLUE CROSS/BLUE SHIELD | Admitting: Internal Medicine

## 2016-06-02 VITALS — BP 126/82 | HR 81 | Temp 98.4°F | Ht 63.5 in | Wt 286.0 lb

## 2016-06-02 DIAGNOSIS — Z1159 Encounter for screening for other viral diseases: Secondary | ICD-10-CM | POA: Diagnosis not present

## 2016-06-02 DIAGNOSIS — Z114 Encounter for screening for human immunodeficiency virus [HIV]: Secondary | ICD-10-CM | POA: Diagnosis not present

## 2016-06-02 DIAGNOSIS — Z1231 Encounter for screening mammogram for malignant neoplasm of breast: Secondary | ICD-10-CM | POA: Diagnosis not present

## 2016-06-02 DIAGNOSIS — Z23 Encounter for immunization: Secondary | ICD-10-CM | POA: Diagnosis not present

## 2016-06-02 DIAGNOSIS — Z0000 Encounter for general adult medical examination without abnormal findings: Secondary | ICD-10-CM

## 2016-06-02 DIAGNOSIS — Z1239 Encounter for other screening for malignant neoplasm of breast: Secondary | ICD-10-CM

## 2016-06-02 DIAGNOSIS — Z0001 Encounter for general adult medical examination with abnormal findings: Secondary | ICD-10-CM

## 2016-06-02 LAB — CBC
HEMATOCRIT: 44 % (ref 36.0–46.0)
HEMOGLOBIN: 14.5 g/dL (ref 12.0–15.0)
MCHC: 33 g/dL (ref 30.0–36.0)
MCV: 83.2 fl (ref 78.0–100.0)
Platelets: 290 10*3/uL (ref 150.0–400.0)
RBC: 5.29 Mil/uL — ABNORMAL HIGH (ref 3.87–5.11)
RDW: 15.3 % (ref 11.5–15.5)
WBC: 6.4 10*3/uL (ref 4.0–10.5)

## 2016-06-02 LAB — LIPID PANEL
CHOLESTEROL: 227 mg/dL — AB (ref 0–200)
HDL: 51 mg/dL (ref >39.00)
LDL Cholesterol: 156 mg/dL — ABNORMAL HIGH (ref 0–99)
NonHDL: 176.32
TRIGLYCERIDES: 100 mg/dL (ref 0.0–149.0)
Total CHOL/HDL Ratio: 4
VLDL: 20 mg/dL (ref 0.0–40.0)

## 2016-06-02 LAB — COMPREHENSIVE METABOLIC PANEL
ALBUMIN: 4 g/dL (ref 3.5–5.2)
ALK PHOS: 87 U/L (ref 39–117)
ALT: 21 U/L (ref 0–35)
AST: 15 U/L (ref 0–37)
BUN: 10 mg/dL (ref 6–23)
CO2: 28 mEq/L (ref 19–32)
Calcium: 9.6 mg/dL (ref 8.4–10.5)
Chloride: 102 mEq/L (ref 96–112)
Creatinine, Ser: 0.75 mg/dL (ref 0.40–1.20)
GFR: 85.52 mL/min (ref >60.00)
Glucose, Bld: 97 mg/dL (ref 70–99)
POTASSIUM: 4 meq/L (ref 3.5–5.1)
Sodium: 138 mEq/L (ref 135–145)
TOTAL PROTEIN: 8.4 g/dL — AB (ref 6.0–8.3)
Total Bilirubin: 0.7 mg/dL (ref 0.2–1.2)

## 2016-06-02 LAB — TSH: TSH: 3.33 u[IU]/mL (ref 0.35–4.50)

## 2016-06-02 LAB — HEMOGLOBIN A1C: Hgb A1c MFr Bld: 5.8 % (ref 4.6–6.5)

## 2016-06-02 MED ORDER — DULERA 200-5 MCG/ACT IN AERO: 2.0000 | INHALATION_SPRAY | Freq: Two times a day (BID) | RESPIRATORY_TRACT | 1 refills | Status: DC

## 2016-06-02 NOTE — Patient Instructions (Signed)

## 2016-06-02 NOTE — Addendum Note (Signed)
Addended by: Roena MaladyEVONTENNO, Demarus Latterell Y on: 06/02/2016 05:02 PM   Modules accepted: Orders

## 2016-06-02 NOTE — Progress Notes (Signed)
Subjective:    Patient ID: Brianna Weaver, female    DOB: 01/02/1961, 56 y.o.   MRN: [id number]  HPI  Pt presents to the clinic today for her annual exam.  Flu: 01/2014 Tetanus: > 10 years ago Pneumovax: never Pap Smear: ? 5 years ago Mammogram: > 2 years ago Colon Screening: never Vision Screening: annually Dentist: biannually  Diet: She does eat meat. She consumes fruits and veggies daily. She does eat some fried foods. She drinks mostly caffeine free Dt. Pepsi. Exercise: None  Review of Systems      Past Medical History:  Diagnosis Date  . Allergy   . Asthma     Current Outpatient Prescriptions  Medication Sig Dispense Refill  . DULERA 200-5 MCG/ACT AERO Inhale 1 puff into the lungs 2 (two) times daily. **USE REGULARLY AND RINSE MOUTH AFTER USE** 1 Inhaler 1  . losartan (COZAAR) 25 MG tablet Take 1 tablet (25 mg total) by mouth daily. 30 tablet 0   No current facility-administered medications for this visit.     Allergies  Allergen Reactions  . Penicillins     Other reaction(s): Respiratory Distress  . Aspirin     Other reaction(s): Respiratory Distress    Family History  Problem Relation Age of Onset  . Arthritis Mother   . Hypertension Mother   . Breast cancer Sister   . Arthritis Paternal Grandmother   . Breast cancer Paternal Grandmother   . Hypertension Paternal Grandmother     Social History   Social History  . Marital status: Married    Spouse name: N/A  . Number of children: N/A  . Years of education: N/A   Occupational History  . Not on file.   Social History Main Topics  . Smoking status: Never Smoker  . Smokeless tobacco: Never Used  . Alcohol use Yes     Comment: rare  . Drug use: No  . Sexual activity: Yes   Other Topics Concern  . Not on file   Social History Narrative  . No narrative on file     Constitutional: Denies fever, malaise, fatigue, headache or abrupt weight changes.  HEENT: Denies eye pain, eye redness,  ear pain, ringing in the ears, wax buildup, runny nose, nasal congestion, bloody nose, or sore throat. Respiratory: Denies difficulty breathing, shortness of breath, cough or sputum production.   Cardiovascular: Denies chest pain, chest tightness, palpitations or swelling in the hands or feet.  Gastrointestinal: Pt reports intermittent relfux. Denies abdominal pain, bloating, constipation, diarrhea or blood in the stool.  GU: Denies urgency, frequency, pain with urination, burning sensation, blood in urine, odor or discharge. Musculoskeletal: Denies decrease in range of motion, difficulty with gait, muscle pain or joint pain and swelling.  Skin: Denies redness, rashes, lesions or ulcercations.  Neurological: Denies dizziness, difficulty with memory, difficulty with speech or problems with balance and coordination.  Psych: Denies anxiety, depression, SI/HI.  No other specific complaints in a complete review of systems (except as listed in HPI above).  Objective:   Physical Exam  BP 126/82   Pulse 81   Temp 98.4 F (36.9 C) (Oral)   Ht 5' 3.5" (1.613 m)   Wt 286 lb (129.7 kg)   SpO2 98%   BMI 49.87 kg/m  Wt Readings from Last 3 Encounters:  06/02/16 286 lb (129.7 kg)  05/19/16 284 lb 12 oz (129.2 kg)    General: Appears herstated age, obese in NAD. Skin: Warm, dry and intact.  HEENT:  Head: normal shape and size; Eyes: sclera white, no icterus, conjunctiva pink, PERRLA and EOMs intact; Ears: Tm's gray and intact, normal light reflex;Throat/Mouth: Teeth present, mucosa pink and moist, no exudate, lesions or ulcerations noted.  Neck:  Neck supple, trachea midline. No masses, lumps or thyromegaly present.  Cardiovascular: Normal rate and rhythm. S1,S2 noted.  No murmur, rubs or gallops noted. No JVD or BLE edema. No carotid bruits noted. Pulmonary/Chest: Normal effort and positive vesicular breath sounds. No respiratory distress. No wheezes, rales or ronchi noted.  Abdomen: Soft and  nontender. Normal bowel sounds. No distention or masses noted. Liver, spleen and kidneys non palpable. Pelvic: Normal female anatomy. No cervix noted. Adnexa non palpable. Musculoskeletal: Normal range of motion. Strength 5/5 BUE/BLE. No difficulty with gait.  Neurological: Alert and oriented. Cranial nerves II-XII grossly intact. Coordination normal.  Psychiatric: Mood and affect normal. Behavior is normal. Judgment and thought content normal.        Assessment & Plan:   Preventative Health Maintenance:  Flu, tdap and pneumovax given today Pelvic exam done today Mammogram ordered- she will call [facility] to schedule- number provided She declines colonoscopy but is agreeable to Cologuard- ordered Encouraged her to consume a balanced diet and exercise regimen Advised her to see an eye doctor and dentist annually Will check CBC, CMET, Lipid, TSH, A1C, HIV and Hep C today  RTC in 1 year, sooner if needed Nicki Reaper, NP

## 2016-06-03 ENCOUNTER — Other Ambulatory Visit: Payer: Self-pay | Admitting: Internal Medicine

## 2016-06-03 ENCOUNTER — Telehealth: Payer: Self-pay

## 2016-06-03 LAB — HEPATITIS C ANTIBODY: HCV AB: NEGATIVE

## 2016-06-03 LAB — HIV ANTIBODY (ROUTINE TESTING W REFLEX): HIV 1&2 Ab, 4th Generation: NONREACTIVE

## 2016-06-03 NOTE — Telephone Encounter (Signed)
cologuard order has been faxed along with copy of insurance demographics

## 2016-06-04 ENCOUNTER — Other Ambulatory Visit: Payer: Self-pay | Admitting: Internal Medicine

## 2016-06-04 DIAGNOSIS — E781 Pure hyperglyceridemia: Secondary | ICD-10-CM

## 2016-06-04 MED ORDER — LOSARTAN POTASSIUM 25 MG PO TABS: 25.0000 mg | ORAL_TABLET | Freq: Every day | ORAL | 11 refills | Status: DC

## 2016-07-04 ENCOUNTER — Ambulatory Visit
Admission: RE | Admit: 2016-07-04 | Discharge: 2016-07-04 | Disposition: A | Discharge status: Home / Self Care | Payer: BLUE CROSS/BLUE SHIELD | Source: Ambulatory Visit | Attending: Internal Medicine | Admitting: Internal Medicine

## 2016-07-04 DIAGNOSIS — Z1231 Encounter for screening mammogram for malignant neoplasm of breast: Secondary | ICD-10-CM | POA: Diagnosis present

## 2016-07-04 DIAGNOSIS — Z1239 Encounter for other screening for malignant neoplasm of breast: Secondary | ICD-10-CM

## 2016-07-05 LAB — COLOGUARD: COLOGUARD: NEGATIVE

## 2016-07-11 ENCOUNTER — Encounter: Payer: Self-pay | Admitting: Internal Medicine

## 2016-07-14 ENCOUNTER — Telehealth: Payer: Self-pay | Admitting: Internal Medicine

## 2016-07-14 ENCOUNTER — Encounter: Payer: Self-pay | Admitting: Internal Medicine

## 2016-07-14 NOTE — Telephone Encounter (Signed)
Mel, Is this in your inbox?

## 2016-07-14 NOTE — Telephone Encounter (Signed)
Patient called to get the results of the Cologuard test.

## 2016-07-14 NOTE — Telephone Encounter (Signed)
We have not received results yet, it was resulted 07/11/16.... Went online and printed result it was negative.... Pt is aware of negative cologuard test

## 2016-07-27 ENCOUNTER — Other Ambulatory Visit: Payer: Self-pay | Admitting: Internal Medicine

## 2016-09-03 ENCOUNTER — Other Ambulatory Visit (INDEPENDENT_AMBULATORY_CARE_PROVIDER_SITE_OTHER): Payer: BLUE CROSS/BLUE SHIELD

## 2016-09-03 DIAGNOSIS — E781 Pure hyperglyceridemia: Secondary | ICD-10-CM | POA: Diagnosis not present

## 2016-09-03 LAB — LIPID PANEL
Cholesterol: 215 mg/dL — ABNORMAL HIGH (ref 0–200)
HDL: 45.8 mg/dL (ref >39.00)
LDL CALC: 149 mg/dL — AB (ref 0–99)
NonHDL: 169.34
Total CHOL/HDL Ratio: 5
Triglycerides: 101 mg/dL (ref 0.0–149.0)
VLDL: 20.2 mg/dL (ref 0.0–40.0)

## 2016-09-09 MED ORDER — SIMVASTATIN 10 MG PO TABS: 10.0000 mg | ORAL_TABLET | Freq: Every day | ORAL | 2 refills | Status: DC

## 2016-09-09 NOTE — Addendum Note (Signed)
Addended by: Roena Malady on: 09/09/2016 02:39 PM   Modules accepted: Orders

## 2016-10-07 ENCOUNTER — Encounter: Payer: Self-pay | Admitting: Internal Medicine

## 2016-10-07 MED ORDER — ATORVASTATIN CALCIUM 10 MG PO TABS: 10.0000 mg | ORAL_TABLET | Freq: Every day | ORAL | 2 refills | Status: DC

## 2016-10-24 ENCOUNTER — Encounter: Payer: Self-pay | Admitting: Internal Medicine

## 2016-10-24 ENCOUNTER — Ambulatory Visit (INDEPENDENT_AMBULATORY_CARE_PROVIDER_SITE_OTHER): Payer: BLUE CROSS/BLUE SHIELD | Admitting: Internal Medicine

## 2016-10-24 VITALS — BP 128/84 | HR 90 | Temp 98.0°F | Wt 278.8 lb

## 2016-10-24 DIAGNOSIS — S46819A Strain of other muscles, fascia and tendons at shoulder and upper arm level, unspecified arm, initial encounter: Secondary | ICD-10-CM | POA: Diagnosis not present

## 2016-10-24 DIAGNOSIS — W19XXXA Unspecified fall, initial encounter: Secondary | ICD-10-CM

## 2016-10-24 NOTE — Patient Instructions (Signed)

## 2016-10-24 NOTE — Progress Notes (Signed)
Subjective:    Patient ID: Brianna Weaver, female    DOB: 07/11/1960, 56 y.o.   MRN: [id number]  HPI  Pt presents to the clinic today with c/o bilateral shoulder, left arm and wrist pain. This started after a fall yesterday while she was walking in her drive way. She hit her face, but tried to catch herself with her arms. She hit her nose and it bled, but she was able to get it to stop. She describes the pain as sore and achy. It seems worse with movement. She denies numbness or tingling. She has tried Tylenol with minimal relief.   Review of Systems      Past Medical History:  Diagnosis Date  . Allergy   . Asthma     Current Outpatient Prescriptions  Medication Sig Dispense Refill  . atorvastatin (LIPITOR) 10 MG tablet Take 1 tablet (10 mg total) by mouth daily. 30 tablet 2  . DULERA 200-5 MCG/ACT AERO Inhale 2 puffs into the lungs 2 (two) times daily. 1 Inhaler 1  . DULERA 200-5 MCG/ACT AERO INHALE 1 PUFF INTO THE LUNGS 2 (TWO) TIMES DAILY. **USE REGULARLY AND RINSE MOUTH AFTER USE** 13 g 10  . losartan (COZAAR) 25 MG tablet Take 1 tablet (25 mg total) by mouth daily. 30 tablet 11   No current facility-administered medications for this visit.     Allergies  Allergen Reactions  . Penicillins     Other reaction(s): Respiratory Distress  . Aspirin     Other reaction(s): Respiratory Distress    Family History  Problem Relation Age of Onset  . Arthritis Mother   . Hypertension Mother   . Breast cancer Sister [id number]  . Arthritis Paternal Grandmother   . Breast cancer Paternal Grandmother [id number]  . Hypertension Paternal Grandmother     Social History   Social History  . Marital status: Married    Spouse name: N/A  . Number of children: N/A  . Years of education: N/A   Occupational History  . Not on file.   Social History Main Topics  . Smoking status: Never Smoker  . Smokeless tobacco: Never Used  . Alcohol use Yes     Comment: rare  . Drug use: No  . Sexual  activity: Yes   Other Topics Concern  . Not on file   Social History Narrative  . No narrative on file     Constitutional: Denies fever, malaise, fatigue, headache or abrupt weight changes.  Respiratory: Denies difficulty breathing, shortness of breath, cough or sputum production.   Cardiovascular: Denies chest pain, chest tightness, palpitations or swelling in the hands or feet.  Musculoskeletal: Pt reports left shoulder, arm and wrist pain. Denies difficulty with gait, or joint swelling.   No other specific complaints in a complete review of systems (except as listed in HPI above).  Objective:   Physical Exam   BP 128/84   Pulse 90   Temp 98 F (36.7 C) (Oral)   Wt 278 lb 12 oz (126.4 kg)   SpO2 98%   BMI 48.60 kg/m  Wt Readings from Last 3 Encounters:  10/24/16 278 lb 12 oz (126.4 kg)  06/02/16 286 lb (129.7 kg)  05/19/16 284 lb 12 oz (129.2 kg)    General: Appears her stated age, obese in NAD. Skin: Abrasion noted to the left side of her nose. Musculoskeletal: Normal internal and external rotation of bilateral shoulders. Normal flexion, extension and rotation of the cervical spine. No pain with palpation  of the shoulder joint or clavicle. Tightness noted with palpation of the trapezius. Negative drop can test bilaterally. Normal flexion, extension and rotation of the left wrist. Strength 5/5 BUE.  Neurological: Alert and oriented.   BMET    Component Value Date/Time   NA 138 06/02/2016 1417   K 4.0 06/02/2016 1417   CL 102 06/02/2016 1417   CO2 28 06/02/2016 1417   GLUCOSE 97 06/02/2016 1417   BUN 10 06/02/2016 1417   CREATININE 0.75 06/02/2016 1417   CALCIUM 9.6 06/02/2016 1417    Lipid Panel     Component Value Date/Time   CHOL 215 (H) 09/03/2016 0744   TRIG 101.0 09/03/2016 0744   HDL 45.80 09/03/2016 0744   CHOLHDL 5 09/03/2016 0744   VLDL 20.2 09/03/2016 0744   LDLCALC 149 (H) 09/03/2016 0744    CBC    Component Value Date/Time   WBC 6.4  06/02/2016 1417   RBC 5.29 (H) 06/02/2016 1417   HGB 14.5 06/02/2016 1417   HCT 44.0 06/02/2016 1417   PLT 290.0 06/02/2016 1417   MCV 83.2 06/02/2016 1417   MCHC 33.0 06/02/2016 1417   RDW 15.3 06/02/2016 1417    Hgb A1C Lab Results  Component Value Date   HGBA1C 5.8 06/02/2016           Assessment & Plan:   Trapezius Muscle Strain, s/p Fall:  Advised her to try Ibuprofen instead of Tylenol Heat may be helpful Massage would be helpful Back exercises given She declines RX for muscle relaxer at this time  Return precautions discussed Nicki Reaper, NP

## 2016-12-10 ENCOUNTER — Other Ambulatory Visit (INDEPENDENT_AMBULATORY_CARE_PROVIDER_SITE_OTHER): Payer: BLUE CROSS/BLUE SHIELD

## 2016-12-10 DIAGNOSIS — E781 Pure hyperglyceridemia: Secondary | ICD-10-CM | POA: Diagnosis not present

## 2016-12-10 LAB — LIPID PANEL
CHOL/HDL RATIO: 3
CHOLESTEROL: 122 mg/dL (ref 0–200)
HDL: 39.5 mg/dL (ref >39.00)
LDL CALC: 67 mg/dL (ref 0–99)
NonHDL: 82.74
TRIGLYCERIDES: 78 mg/dL (ref 0.0–149.0)
VLDL: 15.6 mg/dL (ref 0.0–40.0)

## 2016-12-10 LAB — COMPREHENSIVE METABOLIC PANEL
ALBUMIN: 3.7 g/dL (ref 3.5–5.2)
ALT: 19 U/L (ref 0–35)
AST: 15 U/L (ref 0–37)
Alkaline Phosphatase: 77 U/L (ref 39–117)
BUN: 9 mg/dL (ref 6–23)
CALCIUM: 9 mg/dL (ref 8.4–10.5)
CHLORIDE: 104 meq/L (ref 96–112)
CO2: 30 mEq/L (ref 19–32)
Creatinine, Ser: 0.83 mg/dL (ref 0.40–1.20)
GFR: 75.93 mL/min (ref >60.00)
Glucose, Bld: 111 mg/dL — ABNORMAL HIGH (ref 70–99)
POTASSIUM: 4.3 meq/L (ref 3.5–5.1)
SODIUM: 140 meq/L (ref 135–145)
Total Bilirubin: 0.4 mg/dL (ref 0.2–1.2)
Total Protein: 7.4 g/dL (ref 6.0–8.3)

## 2017-01-03 ENCOUNTER — Other Ambulatory Visit: Payer: Self-pay | Admitting: Internal Medicine

## 2017-01-08 ENCOUNTER — Other Ambulatory Visit: Payer: Self-pay | Admitting: *Deleted

## 2017-01-08 MED ORDER — ATORVASTATIN CALCIUM 10 MG PO TABS: 10.0000 mg | ORAL_TABLET | Freq: Every day | ORAL | 3 refills | Status: DC

## 2017-01-08 NOTE — Telephone Encounter (Signed)
Received call from pt that the pharmacy said they didn't receive her Lipitor, I resent it

## 2017-01-09 ENCOUNTER — Other Ambulatory Visit: Payer: Self-pay

## 2017-01-09 MED ORDER — ATORVASTATIN CALCIUM 10 MG PO TABS: 10.0000 mg | ORAL_TABLET | Freq: Every day | ORAL | 0 refills | Status: DC

## 2017-05-19 DIAGNOSIS — M546 Pain in thoracic spine: Secondary | ICD-10-CM | POA: Diagnosis not present

## 2017-05-19 DIAGNOSIS — M9901 Segmental and somatic dysfunction of cervical region: Secondary | ICD-10-CM | POA: Diagnosis not present

## 2017-05-19 DIAGNOSIS — M9902 Segmental and somatic dysfunction of thoracic region: Secondary | ICD-10-CM | POA: Diagnosis not present

## 2017-05-19 DIAGNOSIS — M542 Cervicalgia: Secondary | ICD-10-CM | POA: Diagnosis not present

## 2017-05-20 DIAGNOSIS — M9901 Segmental and somatic dysfunction of cervical region: Secondary | ICD-10-CM | POA: Diagnosis not present

## 2017-05-20 DIAGNOSIS — M546 Pain in thoracic spine: Secondary | ICD-10-CM | POA: Diagnosis not present

## 2017-05-20 DIAGNOSIS — M542 Cervicalgia: Secondary | ICD-10-CM | POA: Diagnosis not present

## 2017-05-20 DIAGNOSIS — M9902 Segmental and somatic dysfunction of thoracic region: Secondary | ICD-10-CM | POA: Diagnosis not present

## 2017-05-21 DIAGNOSIS — M542 Cervicalgia: Secondary | ICD-10-CM | POA: Diagnosis not present

## 2017-05-21 DIAGNOSIS — M9902 Segmental and somatic dysfunction of thoracic region: Secondary | ICD-10-CM | POA: Diagnosis not present

## 2017-05-21 DIAGNOSIS — M9901 Segmental and somatic dysfunction of cervical region: Secondary | ICD-10-CM | POA: Diagnosis not present

## 2017-05-21 DIAGNOSIS — M546 Pain in thoracic spine: Secondary | ICD-10-CM | POA: Diagnosis not present

## 2017-05-25 DIAGNOSIS — M9901 Segmental and somatic dysfunction of cervical region: Secondary | ICD-10-CM | POA: Diagnosis not present

## 2017-05-25 DIAGNOSIS — M9902 Segmental and somatic dysfunction of thoracic region: Secondary | ICD-10-CM | POA: Diagnosis not present

## 2017-05-25 DIAGNOSIS — M546 Pain in thoracic spine: Secondary | ICD-10-CM | POA: Diagnosis not present

## 2017-05-25 DIAGNOSIS — M542 Cervicalgia: Secondary | ICD-10-CM | POA: Diagnosis not present

## 2017-05-27 DIAGNOSIS — M9902 Segmental and somatic dysfunction of thoracic region: Secondary | ICD-10-CM | POA: Diagnosis not present

## 2017-05-27 DIAGNOSIS — M546 Pain in thoracic spine: Secondary | ICD-10-CM | POA: Diagnosis not present

## 2017-05-27 DIAGNOSIS — M9901 Segmental and somatic dysfunction of cervical region: Secondary | ICD-10-CM | POA: Diagnosis not present

## 2017-05-27 DIAGNOSIS — M542 Cervicalgia: Secondary | ICD-10-CM | POA: Diagnosis not present

## 2017-05-28 DIAGNOSIS — M9901 Segmental and somatic dysfunction of cervical region: Secondary | ICD-10-CM | POA: Diagnosis not present

## 2017-05-28 DIAGNOSIS — M542 Cervicalgia: Secondary | ICD-10-CM | POA: Diagnosis not present

## 2017-05-28 DIAGNOSIS — M9902 Segmental and somatic dysfunction of thoracic region: Secondary | ICD-10-CM | POA: Diagnosis not present

## 2017-05-28 DIAGNOSIS — M546 Pain in thoracic spine: Secondary | ICD-10-CM | POA: Diagnosis not present

## 2017-06-01 DIAGNOSIS — M546 Pain in thoracic spine: Secondary | ICD-10-CM | POA: Diagnosis not present

## 2017-06-01 DIAGNOSIS — M542 Cervicalgia: Secondary | ICD-10-CM | POA: Diagnosis not present

## 2017-06-01 DIAGNOSIS — M9901 Segmental and somatic dysfunction of cervical region: Secondary | ICD-10-CM | POA: Diagnosis not present

## 2017-06-01 DIAGNOSIS — M9902 Segmental and somatic dysfunction of thoracic region: Secondary | ICD-10-CM | POA: Diagnosis not present

## 2017-06-03 ENCOUNTER — Other Ambulatory Visit: Payer: Self-pay | Admitting: Internal Medicine

## 2017-06-03 DIAGNOSIS — M9901 Segmental and somatic dysfunction of cervical region: Secondary | ICD-10-CM | POA: Diagnosis not present

## 2017-06-03 DIAGNOSIS — M546 Pain in thoracic spine: Secondary | ICD-10-CM | POA: Diagnosis not present

## 2017-06-03 DIAGNOSIS — M9902 Segmental and somatic dysfunction of thoracic region: Secondary | ICD-10-CM | POA: Diagnosis not present

## 2017-06-03 DIAGNOSIS — M542 Cervicalgia: Secondary | ICD-10-CM | POA: Diagnosis not present

## 2017-06-04 DIAGNOSIS — M9901 Segmental and somatic dysfunction of cervical region: Secondary | ICD-10-CM | POA: Diagnosis not present

## 2017-06-04 DIAGNOSIS — M546 Pain in thoracic spine: Secondary | ICD-10-CM | POA: Diagnosis not present

## 2017-06-04 DIAGNOSIS — M542 Cervicalgia: Secondary | ICD-10-CM | POA: Diagnosis not present

## 2017-06-04 DIAGNOSIS — M9902 Segmental and somatic dysfunction of thoracic region: Secondary | ICD-10-CM | POA: Diagnosis not present

## 2017-06-08 DIAGNOSIS — M546 Pain in thoracic spine: Secondary | ICD-10-CM | POA: Diagnosis not present

## 2017-06-08 DIAGNOSIS — M542 Cervicalgia: Secondary | ICD-10-CM | POA: Diagnosis not present

## 2017-06-08 DIAGNOSIS — M9902 Segmental and somatic dysfunction of thoracic region: Secondary | ICD-10-CM | POA: Diagnosis not present

## 2017-06-08 DIAGNOSIS — M9901 Segmental and somatic dysfunction of cervical region: Secondary | ICD-10-CM | POA: Diagnosis not present

## 2017-06-10 DIAGNOSIS — M9901 Segmental and somatic dysfunction of cervical region: Secondary | ICD-10-CM | POA: Diagnosis not present

## 2017-06-10 DIAGNOSIS — M546 Pain in thoracic spine: Secondary | ICD-10-CM | POA: Diagnosis not present

## 2017-06-10 DIAGNOSIS — M542 Cervicalgia: Secondary | ICD-10-CM | POA: Diagnosis not present

## 2017-06-10 DIAGNOSIS — M9902 Segmental and somatic dysfunction of thoracic region: Secondary | ICD-10-CM | POA: Diagnosis not present

## 2017-06-11 DIAGNOSIS — M9902 Segmental and somatic dysfunction of thoracic region: Secondary | ICD-10-CM | POA: Diagnosis not present

## 2017-06-11 DIAGNOSIS — M542 Cervicalgia: Secondary | ICD-10-CM | POA: Diagnosis not present

## 2017-06-11 DIAGNOSIS — M546 Pain in thoracic spine: Secondary | ICD-10-CM | POA: Diagnosis not present

## 2017-06-11 DIAGNOSIS — M9901 Segmental and somatic dysfunction of cervical region: Secondary | ICD-10-CM | POA: Diagnosis not present

## 2017-06-15 DIAGNOSIS — M546 Pain in thoracic spine: Secondary | ICD-10-CM | POA: Diagnosis not present

## 2017-06-15 DIAGNOSIS — M9901 Segmental and somatic dysfunction of cervical region: Secondary | ICD-10-CM | POA: Diagnosis not present

## 2017-06-15 DIAGNOSIS — M9902 Segmental and somatic dysfunction of thoracic region: Secondary | ICD-10-CM | POA: Diagnosis not present

## 2017-06-15 DIAGNOSIS — M542 Cervicalgia: Secondary | ICD-10-CM | POA: Diagnosis not present

## 2017-06-24 ENCOUNTER — Other Ambulatory Visit: Payer: Self-pay | Admitting: Internal Medicine

## 2017-06-24 DIAGNOSIS — Z1231 Encounter for screening mammogram for malignant neoplasm of breast: Secondary | ICD-10-CM

## 2017-06-30 DIAGNOSIS — H35033 Hypertensive retinopathy, bilateral: Secondary | ICD-10-CM | POA: Diagnosis not present

## 2017-07-02 ENCOUNTER — Other Ambulatory Visit: Payer: Self-pay | Admitting: Internal Medicine

## 2017-07-06 ENCOUNTER — Telehealth: Payer: Self-pay | Admitting: Internal Medicine

## 2017-07-06 NOTE — Telephone Encounter (Signed)
Copied from CRM [phone]. Topic: Quick Communication - Rx Refill/Question >> Jul 06, 2017  3:17 PM Rudi CocoLathan, Clessie Karras M, [place] wrote: Medication:  losartan (COZAAR) 25 MG tablet [[id number]][[id number]]   Has the patient contacted their pharmacy? yes   (Agent: If no, request that the patient contact the pharmacy for the refill.)   Preferred Pharmacy (with phone number or street name): [id number] [place] [place] - [address] [place] [id number] Phone: [phone] Fax: [phone]     Agent: Please be advised that RX refills may take up to 3 business days. We ask that you follow-up with your pharmacy.

## 2017-07-07 ENCOUNTER — Ambulatory Visit
Admission: RE | Admit: 2017-07-07 | Discharge: 2017-07-07 | Disposition: A | Discharge status: Home / Self Care | Payer: BLUE CROSS/BLUE SHIELD | Source: Ambulatory Visit | Attending: Internal Medicine | Admitting: Internal Medicine

## 2017-07-07 DIAGNOSIS — Z1231 Encounter for screening mammogram for malignant neoplasm of breast: Secondary | ICD-10-CM | POA: Diagnosis not present

## 2017-07-23 ENCOUNTER — Encounter: Payer: Self-pay | Admitting: Internal Medicine

## 2017-07-23 ENCOUNTER — Encounter: Payer: BLUE CROSS/BLUE SHIELD | Admitting: Internal Medicine

## 2017-07-23 ENCOUNTER — Ambulatory Visit (INDEPENDENT_AMBULATORY_CARE_PROVIDER_SITE_OTHER): Payer: BLUE CROSS/BLUE SHIELD | Admitting: Internal Medicine

## 2017-07-23 VITALS — BP 126/88 | HR 74 | Temp 97.9°F | Ht 63.5 in | Wt 282.0 lb

## 2017-07-23 DIAGNOSIS — I10 Essential (primary) hypertension: Secondary | ICD-10-CM | POA: Diagnosis not present

## 2017-07-23 DIAGNOSIS — Z23 Encounter for immunization: Secondary | ICD-10-CM

## 2017-07-23 DIAGNOSIS — J4530 Mild persistent asthma, uncomplicated: Secondary | ICD-10-CM | POA: Diagnosis not present

## 2017-07-23 DIAGNOSIS — Z0001 Encounter for general adult medical examination with abnormal findings: Secondary | ICD-10-CM

## 2017-07-23 DIAGNOSIS — E7800 Pure hypercholesterolemia, unspecified: Secondary | ICD-10-CM

## 2017-07-23 NOTE — Progress Notes (Signed)
Subjective:    Patient ID: Brianna Weaver, female    DOB: 01-25-1961, 57 y.o.   MRN: [id number]  HPI  Pt presents to the clinic today for her annual exam. She is also due to follow up chronic conditions.  Asthma: Mild, persistent. Controlled on Dulera  HTN: Her BP today is 126/88. She is taking Losartan as prescribed. ECG from reviewed.  HLD: Her last LDL was 67, 12/2016. She is taking Atorvastatin as prescribed. She denies myalgias.  Flu: 05/2016 Tetanus: 05/2016 Pneumovax: 05/2016 Pap Smear: hysterectomy Mammogram: 06/2017 Colon Screening: 06/2016 Vision Screening: annually Dentist: biannually  Diet: She does eat meat. She consumes fruits and veggies daily. She tries to avoid fried foods. She drinks mostly water. Exercise: None  Review of Systems      Past Medical History:  Diagnosis Date  . Allergy   . Asthma     Current Outpatient Medications  Medication Sig Dispense Refill  . atorvastatin (LIPITOR) 10 MG tablet Take 1 tablet (10 mg total) by mouth daily. 90 tablet 0  . DULERA 200-5 MCG/ACT AERO INHALE 1 PUFF INTO THE LUNGS 2 (TWO) TIMES DAILY. **USE REGULARLY AND RINSE MOUTH AFTER USE** 13 g 10  . losartan (COZAAR) 25 MG tablet TAKE 1 TABLET (25 MG TOTAL) BY MOUTH DAILY. MUST SCHEDULE PHYSICAL EXAM 30 tablet 0   No current facility-administered medications for this visit.     Allergies  Allergen Reactions  . Penicillins     Other reaction(s): Respiratory Distress  . Aspirin     Other reaction(s): Respiratory Distress    Family History  Problem Relation Age of Onset  . Arthritis Mother   . Hypertension Mother   . Breast cancer Sister 90  . Arthritis Paternal Grandmother   . Breast cancer Paternal Grandmother 74  . Hypertension Paternal Grandmother     Social History   Socioeconomic History  . Marital status: Married    Spouse name: Not on file  . Number of children: Not on file  . Years of education: Not on file  . Highest education level: Not on  file  Social Needs  . Financial resource strain: Not on file  . Food insecurity - worry: Not on file  . Food insecurity - inability: Not on file  . Transportation needs - medical: Not on file  . Transportation needs - non-medical: Not on file  Occupational History  . Not on file  Tobacco Use  . Smoking status: Never Smoker  . Smokeless tobacco: Never Used  Substance and Sexual Activity  . Alcohol use: Yes    Comment: rare  . Drug use: No  . Sexual activity: Yes  Other Topics Concern  . Not on file  Social History Narrative  . Not on file     Constitutional: Denies fever, malaise, fatigue, headache or abrupt weight changes.  HEENT: Denies eye pain, eye redness, ear pain, ringing in the ears, wax buildup, runny nose, nasal congestion, bloody nose, or sore throat. Respiratory: Denies difficulty breathing, shortness of breath, cough or sputum production.   Cardiovascular: Denies chest pain, chest tightness, palpitations or swelling in the hands or feet.  Gastrointestinal: Denies abdominal pain, bloating, constipation, diarrhea or blood in the stool.  GU: Denies urgency, frequency, pain with urination, burning sensation, blood in urine, odor or discharge. Musculoskeletal: Denies decrease in range of motion, difficulty with gait, muscle pain or joint pain and swelling.  Skin: Denies redness, rashes, lesions or ulcercations.  Neurological: Denies dizziness, difficulty with memory,  difficulty with speech or problems with balance and coordination.  Psych: Denies anxiety, depression, SI/HI.  No other specific complaints in a complete review of systems (except as listed in HPI above).  Objective:   Physical Exam   BP 126/88   Pulse 74   Temp 97.9 F (36.6 C) (Oral)   Ht 5' 3.5" (1.613 m)   Wt 282 lb (127.9 kg)   SpO2 98%   BMI 49.17 kg/m  Wt Readings from Last 3 Encounters:  07/23/17 282 lb (127.9 kg)  10/24/16 278 lb 12 oz (126.4 kg)  06/02/16 286 lb (129.7 kg)     General: Appears her stated age,obese in NAD. Skin: Warm, dry and intact.  HEENT: Head: normal shape and size; Eyes: sclera white, no icterus, conjunctiva pink, PERRLA and EOMs intact; Ears: Tm's gray and intact, normal light reflex; Throat/Mouth: Teeth present, mucosa pink and moist, no exudate, lesions or ulcerations noted.  Neck:  Neck supple, trachea midline. No masses, lumps or thyromegaly present.  Cardiovascular: Normal rate and rhythm. S1,S2 noted.  No murmur, rubs or gallops noted. No JVD or BLE edema. No carotid bruits noted. Pulmonary/Chest: Normal effort and positive vesicular breath sounds. No respiratory distress. No wheezes, rales or ronchi noted.  Abdomen: Soft and nontender. Normal bowel sounds. No distention or masses noted. Liver, spleen and kidneys non palpable. Musculoskeletal: Strength 5/5 BUE/BLE. No difficulty with gait.  Neurological: Alert and oriented. Cranial nerves II-XII grossly intact. Coordination normal.  Psychiatric: Mood and affect normal. Behavior is normal. Judgment and thought content normal.    BMET    Component Value Date/Time   NA 138 07/23/2017 1601   K 4.4 07/23/2017 1601   CL 102 07/23/2017 1601   CO2 29 07/23/2017 1601   GLUCOSE 92 07/23/2017 1601   BUN 10 07/23/2017 1601   CREATININE 0.74 07/23/2017 1601   CALCIUM 10.0 07/23/2017 1601    Lipid Panel     Component Value Date/Time   CHOL 124 07/23/2017 1601   TRIG 79.0 07/23/2017 1601   HDL 48.40 07/23/2017 1601   CHOLHDL 3 07/23/2017 1601   VLDL 15.8 07/23/2017 1601   LDLCALC 60 07/23/2017 1601    CBC    Component Value Date/Time   WBC 7.3 07/23/2017 1601   RBC 5.32 (H) 07/23/2017 1601   HGB 14.8 07/23/2017 1601   HCT 45.8 07/23/2017 1601   PLT 286.0 07/23/2017 1601   MCV 86.1 07/23/2017 1601   MCHC 32.4 07/23/2017 1601   RDW 14.3 07/23/2017 1601    Hgb A1C Lab Results  Component Value Date   HGBA1C 5.8 06/02/2016           Assessment & Plan:    Preventative Health Maintenance:  Flu shot today Tetanus and pneumovax UTD Mammogram UTD She does not need pap smears Colon screening UTD Encouraged her to consume a balanced diet and exercise regimen Advised her to see an eye doctor and dentist annually Will check CBC, CMET, Lipid and Vit D today  RTC in 1 year, sooner if needed Nicki ReaperBAITY, REGINA, NP

## 2017-07-24 LAB — LIPID PANEL
Cholesterol: 124 mg/dL (ref 0–200)
HDL: 48.4 mg/dL (ref >39.00)
LDL Cholesterol: 60 mg/dL (ref 0–99)
NonHDL: 75.96
Total CHOL/HDL Ratio: 3
Triglycerides: 79 mg/dL (ref 0.0–149.0)
VLDL: 15.8 mg/dL (ref 0.0–40.0)

## 2017-07-24 LAB — CBC
HCT: 45.8 % (ref 36.0–46.0)
Hemoglobin: 14.8 g/dL (ref 12.0–15.0)
MCHC: 32.4 g/dL (ref 30.0–36.0)
MCV: 86.1 fl (ref 78.0–100.0)
Platelets: 286 10*3/uL (ref 150.0–400.0)
RBC: 5.32 Mil/uL — AB (ref 3.87–5.11)
RDW: 14.3 % (ref 11.5–15.5)
WBC: 7.3 10*3/uL (ref 4.0–10.5)

## 2017-07-24 LAB — COMPREHENSIVE METABOLIC PANEL
ALBUMIN: 4.4 g/dL (ref 3.5–5.2)
ALK PHOS: 93 U/L (ref 39–117)
ALT: 23 U/L (ref 0–35)
AST: 15 U/L (ref 0–37)
BUN: 10 mg/dL (ref 6–23)
CO2: 29 mEq/L (ref 19–32)
CREATININE: 0.74 mg/dL (ref 0.40–1.20)
Calcium: 10 mg/dL (ref 8.4–10.5)
Chloride: 102 mEq/L (ref 96–112)
GFR: 86.49 mL/min (ref >60.00)
Glucose, Bld: 92 mg/dL (ref 70–99)
Potassium: 4.4 mEq/L (ref 3.5–5.1)
SODIUM: 138 meq/L (ref 135–145)
TOTAL PROTEIN: 8.5 g/dL — AB (ref 6.0–8.3)
Total Bilirubin: 0.8 mg/dL (ref 0.2–1.2)

## 2017-07-24 LAB — VITAMIN D 25 HYDROXY (VIT D DEFICIENCY, FRACTURES): VITD: 12.4 ng/mL — AB (ref 30.00–100.00)

## 2017-07-28 ENCOUNTER — Other Ambulatory Visit: Payer: Self-pay | Admitting: Internal Medicine

## 2017-07-28 DIAGNOSIS — E559 Vitamin D deficiency, unspecified: Secondary | ICD-10-CM

## 2017-07-28 MED ORDER — VITAMIN D (ERGOCALCIFEROL) 1.25 MG (50000 UNIT) PO CAPS: 50000.0000 [IU] | ORAL_CAPSULE | ORAL | 0 refills | Status: DC

## 2017-07-29 ENCOUNTER — Other Ambulatory Visit: Payer: Self-pay | Admitting: Internal Medicine

## 2017-07-29 DIAGNOSIS — E785 Hyperlipidemia, unspecified: Secondary | ICD-10-CM | POA: Insufficient documentation

## 2017-07-29 NOTE — Assessment & Plan Note (Signed)
CMET and lipid profile today Encouraged her to consume a low fat diet Continue Atorvastatin for now, will adjust if needed based on labs 

## 2017-07-29 NOTE — Patient Instructions (Signed)
Health Maintenance for Postmenopausal Women Menopause is a normal process in which your reproductive ability comes to an end. This process happens gradually over a span of months to years, usually between the ages of 22 and 9. Menopause is complete when you have missed 12 consecutive menstrual periods. It is important to talk with your health care provider about some of the most common conditions that affect postmenopausal women, such as heart disease, cancer, and bone loss (osteoporosis). Adopting a healthy lifestyle and getting preventive care can help to promote your health and wellness. Those actions can also lower your chances of developing some of these common conditions. What should I know about menopause? During menopause, you may experience a number of symptoms, such as:  Moderate-to-severe hot flashes.  Night sweats.  Decrease in sex drive.  Mood swings.  Headaches.  Tiredness.  Irritability.  Memory problems.  Insomnia.  Choosing to treat or not to treat menopausal changes is an individual decision that you make with your health care provider. What should I know about hormone replacement therapy and supplements? Hormone therapy products are effective for treating symptoms that are associated with menopause, such as hot flashes and night sweats. Hormone replacement carries certain risks, especially as you become older. If you are thinking about using estrogen or estrogen with progestin treatments, discuss the benefits and risks with your health care provider. What should I know about heart disease and stroke? Heart disease, heart attack, and stroke become more likely as you age. This may be due, in part, to the hormonal changes that your body experiences during menopause. These can affect how your body processes dietary fats, triglycerides, and cholesterol. Heart attack and stroke are both medical emergencies. There are many things that you can do to help prevent heart disease  and stroke:  Have your blood pressure checked at least every 1-2 years. High blood pressure causes heart disease and increases the risk of stroke.  If you are 53-22 years old, ask your health care provider if you should take aspirin to prevent a heart attack or a stroke.  Do not use any tobacco products, including cigarettes, chewing tobacco, or electronic cigarettes. If you need help quitting, ask your health care provider.  It is important to eat a healthy diet and maintain a healthy weight. ? Be sure to include plenty of vegetables, fruits, low-fat dairy products, and lean protein. ? Avoid eating foods that are high in solid fats, added sugars, or salt (sodium).  Get regular exercise. This is one of the most important things that you can do for your health. ? Try to exercise for at least 150 minutes each week. The type of exercise that you do should increase your heart rate and make you sweat. This is known as moderate-intensity exercise. ? Try to do strengthening exercises at least twice each week. Do these in addition to the moderate-intensity exercise.  Know your numbers.Ask your health care provider to check your cholesterol and your blood glucose. Continue to have your blood tested as directed by your health care provider.  What should I know about cancer screening? There are several types of cancer. Take the following steps to reduce your risk and to catch any cancer development as early as possible. Breast Cancer  Practice breast self-awareness. ? This means understanding how your breasts normally appear and feel. ? It also means doing regular breast self-exams. Let your health care provider know about any changes, no matter how small.  If you are 40  or older, have a clinician do a breast exam (clinical breast exam or CBE) every year. Depending on your age, family history, and medical history, it may be recommended that you also have a yearly breast X-ray (mammogram).  If you  have a family history of breast cancer, talk with your health care provider about genetic screening.  If you are at high risk for breast cancer, talk with your health care provider about having an MRI and a mammogram every year.  Breast cancer (BRCA) gene test is recommended for women who have family members with BRCA-related cancers. Results of the assessment will determine the need for genetic counseling and BRCA1 and for BRCA2 testing. BRCA-related cancers include these types: ? Breast. This occurs in males or females. ? Ovarian. ? Tubal. This may also be called fallopian tube cancer. ? Cancer of the abdominal or pelvic lining (peritoneal cancer). ? Prostate. ? Pancreatic.  Cervical, Uterine, and Ovarian Cancer Your health care provider may recommend that you be screened regularly for cancer of the pelvic organs. These include your ovaries, uterus, and vagina. This screening involves a pelvic exam, which includes checking for microscopic changes to the surface of your cervix (Pap test).  For women ages 21-65, health care providers may recommend a pelvic exam and a Pap test every three years. For women ages 79-65, they may recommend the Pap test and pelvic exam, combined with testing for human papilloma virus (HPV), every five years. Some types of HPV increase your risk of cervical cancer. Testing for HPV may also be done on women of any age who have unclear Pap test results.  Other health care providers may not recommend any screening for nonpregnant women who are considered low risk for pelvic cancer and have no symptoms. Ask your health care provider if a screening pelvic exam is right for you.  If you have had past treatment for cervical cancer or a condition that could lead to cancer, you need Pap tests and screening for cancer for at least 20 years after your treatment. If Pap tests have been discontinued for you, your risk factors (such as having a new sexual partner) need to be  reassessed to determine if you should start having screenings again. Some women have medical problems that increase the chance of getting cervical cancer. In these cases, your health care provider may recommend that you have screening and Pap tests more often.  If you have a family history of uterine cancer or ovarian cancer, talk with your health care provider about genetic screening.  If you have vaginal bleeding after reaching menopause, tell your health care provider.  There are currently no reliable tests available to screen for ovarian cancer.  Lung Cancer Lung cancer screening is recommended for adults 69-62 years old who are at high risk for lung cancer because of a history of smoking. A yearly low-dose CT scan of the lungs is recommended if you:  Currently smoke.  Have a history of at least 30 pack-years of smoking and you currently smoke or have quit within the past 15 years. A pack-year is smoking an average of one pack of cigarettes per day for one year.  Yearly screening should:  Continue until it has been 15 years since you quit.  Stop if you develop a health problem that would prevent you from having lung cancer treatment.  Colorectal Cancer  This type of cancer can be detected and can often be prevented.  Routine colorectal cancer screening usually begins at  age 42 and continues through age 45.  If you have risk factors for colon cancer, your health care provider may recommend that you be screened at an earlier age.  If you have a family history of colorectal cancer, talk with your health care provider about genetic screening.  Your health care provider may also recommend using home test kits to check for hidden blood in your stool.  A small camera at the end of a tube can be used to examine your colon directly (sigmoidoscopy or colonoscopy). This is done to check for the earliest forms of colorectal cancer.  Direct examination of the colon should be repeated every  5-10 years until age 71. However, if early forms of precancerous polyps or small growths are found or if you have a family history or genetic risk for colorectal cancer, you may need to be screened more often.  Skin Cancer  Check your skin from head to toe regularly.  Monitor any moles. Be sure to tell your health care provider: ? About any new moles or changes in moles, especially if there is a change in a mole's shape or color. ? If you have a mole that is larger than the size of a pencil eraser.  If any of your family members has a history of skin cancer, especially at a young age, talk with your health care provider about genetic screening.  Always use sunscreen. Apply sunscreen liberally and repeatedly throughout the day.  Whenever you are outside, protect yourself by wearing long sleeves, pants, a wide-brimmed hat, and sunglasses.  What should I know about osteoporosis? Osteoporosis is a condition in which bone destruction happens more quickly than new bone creation. After menopause, you may be at an increased risk for osteoporosis. To help prevent osteoporosis or the bone fractures that can happen because of osteoporosis, the following is recommended:  If you are 46-71 years old, get at least 1,000 mg of calcium and at least 600 mg of vitamin D per day.  If you are older than age 55 but younger than age 65, get at least 1,200 mg of calcium and at least 600 mg of vitamin D per day.  If you are older than age 54, get at least 1,200 mg of calcium and at least 800 mg of vitamin D per day.  Smoking and excessive alcohol intake increase the risk of osteoporosis. Eat foods that are rich in calcium and vitamin D, and do weight-bearing exercises several times each week as directed by your health care provider. What should I know about how menopause affects my mental health? Depression may occur at any age, but it is more common as you become older. Common symptoms of depression  include:  Low or sad mood.  Changes in sleep patterns.  Changes in appetite or eating patterns.  Feeling an overall lack of motivation or enjoyment of activities that you previously enjoyed.  Frequent crying spells.  Talk with your health care provider if you think that you are experiencing depression. What should I know about immunizations? It is important that you get and maintain your immunizations. These include:  Tetanus, diphtheria, and pertussis (Tdap) booster vaccine.  Influenza every year before the flu season begins.  Pneumonia vaccine.  Shingles vaccine.  Your health care provider may also recommend other immunizations. This information is not intended to replace advice given to you by your health care provider. Make sure you discuss any questions you have with your health care provider. Document Released: 06/20/2005  Document Revised: 11/16/2015 Document Reviewed: 01/30/2015 Elsevier Interactive Patient Education  2018 [employer]  

## 2017-07-29 NOTE — Assessment & Plan Note (Signed)
Controlled on Dulera Will monitor

## 2017-07-29 NOTE — Assessment & Plan Note (Signed)
Controlled on Losartan CBC and CMET Today Reinforced DASH diet and exercise for weight loss

## 2017-08-05 ENCOUNTER — Other Ambulatory Visit: Payer: Self-pay | Admitting: Internal Medicine

## 2017-10-18 ENCOUNTER — Other Ambulatory Visit: Payer: Self-pay | Admitting: Internal Medicine

## 2017-10-18 DIAGNOSIS — E559 Vitamin D deficiency, unspecified: Secondary | ICD-10-CM

## 2017-10-19 ENCOUNTER — Encounter: Payer: Self-pay | Admitting: Internal Medicine

## 2017-10-21 ENCOUNTER — Other Ambulatory Visit: Payer: BLUE CROSS/BLUE SHIELD

## 2017-10-22 ENCOUNTER — Other Ambulatory Visit: Payer: Self-pay | Admitting: Internal Medicine

## 2017-10-22 DIAGNOSIS — E559 Vitamin D deficiency, unspecified: Secondary | ICD-10-CM

## 2017-10-23 ENCOUNTER — Other Ambulatory Visit: Payer: BLUE CROSS/BLUE SHIELD

## 2017-10-26 ENCOUNTER — Other Ambulatory Visit (INDEPENDENT_AMBULATORY_CARE_PROVIDER_SITE_OTHER): Payer: BLUE CROSS/BLUE SHIELD

## 2017-10-26 DIAGNOSIS — E559 Vitamin D deficiency, unspecified: Secondary | ICD-10-CM

## 2017-10-26 LAB — VITAMIN D 25 HYDROXY (VIT D DEFICIENCY, FRACTURES): VITD: 29.87 ng/mL — AB (ref 30.00–100.00)

## 2017-10-28 ENCOUNTER — Encounter: Payer: Self-pay | Admitting: Internal Medicine

## 2017-11-17 ENCOUNTER — Other Ambulatory Visit: Payer: Self-pay | Admitting: Internal Medicine

## 2017-12-31 DIAGNOSIS — H35363 Drusen (degenerative) of macula, bilateral: Secondary | ICD-10-CM | POA: Diagnosis not present

## 2018-03-07 ENCOUNTER — Other Ambulatory Visit: Payer: Self-pay | Admitting: Internal Medicine

## 2018-06-02 ENCOUNTER — Other Ambulatory Visit: Payer: Self-pay | Admitting: Internal Medicine

## 2018-06-02 DIAGNOSIS — Z1231 Encounter for screening mammogram for malignant neoplasm of breast: Secondary | ICD-10-CM

## 2018-07-03 ENCOUNTER — Other Ambulatory Visit: Payer: Self-pay | Admitting: Internal Medicine

## 2018-07-05 ENCOUNTER — Ambulatory Visit
Admission: RE | Admit: 2018-07-05 | Discharge: 2018-07-05 | Disposition: A | Discharge status: Home / Self Care | Payer: BLUE CROSS/BLUE SHIELD | Source: Ambulatory Visit | Attending: Internal Medicine | Admitting: Internal Medicine

## 2018-07-05 DIAGNOSIS — Z1231 Encounter for screening mammogram for malignant neoplasm of breast: Secondary | ICD-10-CM

## 2018-09-13 ENCOUNTER — Telehealth: Payer: Self-pay | Admitting: *Deleted

## 2018-09-13 ENCOUNTER — Emergency Department: Payer: BLUE CROSS/BLUE SHIELD

## 2018-09-13 ENCOUNTER — Emergency Department
Admission: EM | Admit: 2018-09-13 | Discharge: 2018-09-13 | Disposition: A | Discharge status: Home / Self Care | Payer: BLUE CROSS/BLUE SHIELD | Attending: Emergency Medicine | Admitting: Emergency Medicine

## 2018-09-13 ENCOUNTER — Encounter: Payer: Self-pay | Admitting: Emergency Medicine

## 2018-09-13 ENCOUNTER — Other Ambulatory Visit: Payer: Self-pay

## 2018-09-13 DIAGNOSIS — I10 Essential (primary) hypertension: Secondary | ICD-10-CM | POA: Diagnosis not present

## 2018-09-13 DIAGNOSIS — N130 Hydronephrosis with ureteropelvic junction obstruction: Secondary | ICD-10-CM | POA: Insufficient documentation

## 2018-09-13 DIAGNOSIS — N201 Calculus of ureter: Secondary | ICD-10-CM | POA: Diagnosis not present

## 2018-09-13 DIAGNOSIS — J45909 Unspecified asthma, uncomplicated: Secondary | ICD-10-CM | POA: Insufficient documentation

## 2018-09-13 DIAGNOSIS — N132 Hydronephrosis with renal and ureteral calculous obstruction: Secondary | ICD-10-CM | POA: Diagnosis not present

## 2018-09-13 DIAGNOSIS — Z79899 Other long term (current) drug therapy: Secondary | ICD-10-CM | POA: Insufficient documentation

## 2018-09-13 DIAGNOSIS — R1032 Left lower quadrant pain: Secondary | ICD-10-CM | POA: Diagnosis not present

## 2018-09-13 LAB — CBC WITH DIFFERENTIAL/PLATELET
Abs Immature Granulocytes: 0.04 10*3/uL (ref 0.00–0.07)
Basophils Absolute: 0 10*3/uL (ref 0.0–0.1)
Basophils Relative: 1 %
Eosinophils Absolute: 0.1 10*3/uL (ref 0.0–0.5)
Eosinophils Relative: 1 %
HCT: 48.7 % — ABNORMAL HIGH (ref 36.0–46.0)
Hemoglobin: 15.3 g/dL — ABNORMAL HIGH (ref 12.0–15.0)
Immature Granulocytes: 1 %
Lymphocytes Relative: 18 %
Lymphs Abs: 1.6 10*3/uL (ref 0.7–4.0)
MCH: 27.9 pg (ref 26.0–34.0)
MCHC: 31.4 g/dL (ref 30.0–36.0)
MCV: 88.7 fL (ref 80.0–100.0)
Monocytes Absolute: 0.5 10*3/uL (ref 0.1–1.0)
Monocytes Relative: 5 %
Neutro Abs: 6.5 10*3/uL (ref 1.7–7.7)
Neutrophils Relative %: 74 %
Platelets: 228 10*3/uL (ref 150–400)
RBC: 5.49 MIL/uL — ABNORMAL HIGH (ref 3.87–5.11)
RDW: 14.5 % (ref 11.5–15.5)
WBC: 8.7 10*3/uL (ref 4.0–10.5)
nRBC: 0 % (ref 0.0–0.2)

## 2018-09-13 LAB — URINALYSIS, COMPLETE (UACMP) WITH MICROSCOPIC
Bacteria, UA: NONE SEEN
Bilirubin Urine: NEGATIVE
Glucose, UA: NEGATIVE mg/dL
Ketones, ur: 20 mg/dL — AB
Nitrite: NEGATIVE
Protein, ur: NEGATIVE mg/dL
RBC / HPF: >50 RBC/hpf — ABNORMAL HIGH (ref 0–5)
Specific Gravity, Urine: 1.024 (ref 1.005–1.030)
pH: 5 (ref 5.0–8.0)

## 2018-09-13 LAB — COMPREHENSIVE METABOLIC PANEL
ALT: 21 U/L (ref 0–44)
AST: 18 U/L (ref 15–41)
Albumin: 4.8 g/dL (ref 3.5–5.0)
Alkaline Phosphatase: 92 U/L (ref 38–126)
Anion gap: 10 (ref 5–15)
BUN: 16 mg/dL (ref 6–20)
CO2: 26 mmol/L (ref 22–32)
Calcium: 9.7 mg/dL (ref 8.9–10.3)
Chloride: 102 mmol/L (ref 98–111)
Creatinine, Ser: 0.9 mg/dL (ref 0.44–1.00)
GFR calc Af Amer: >60 mL/min (ref >60)
GFR calc non Af Amer: >60 mL/min (ref >60)
Glucose, Bld: 113 mg/dL — ABNORMAL HIGH (ref 70–99)
Potassium: 4.1 mmol/L (ref 3.5–5.1)
Sodium: 138 mmol/L (ref 135–145)
Total Bilirubin: 1.1 mg/dL (ref 0.3–1.2)
Total Protein: 9.4 g/dL — ABNORMAL HIGH (ref 6.5–8.1)

## 2018-09-13 LAB — LIPASE, BLOOD: Lipase: 25 U/L (ref 11–51)

## 2018-09-13 MED ORDER — NAPROXEN 500 MG PO TABS: 500.0000 mg | ORAL_TABLET | Freq: Two times a day (BID) | ORAL | 2 refills | Status: DC

## 2018-09-13 MED ORDER — HYDROCODONE-ACETAMINOPHEN 5-325 MG PO TABS: 1.0000 | ORAL_TABLET | ORAL | 0 refills | Status: DC | PRN

## 2018-09-13 MED ORDER — ONDANSETRON 4 MG PO TBDP: 4.0000 mg | ORAL_TABLET | Freq: Three times a day (TID) | ORAL | 0 refills | Status: DC | PRN

## 2018-09-13 MED ORDER — SODIUM CHLORIDE 0.9 % IV SOLN
Freq: Once | INTRAVENOUS | Status: AC
  Administered 2018-09-13: 15:00:00 via INTRAVENOUS

## 2018-09-13 MED ORDER — MORPHINE SULFATE (PF) 4 MG/ML IV SOLN
4.0000 mg | Freq: Once | INTRAVENOUS | Status: AC
  Administered 2018-09-13: 14:00:00 4 mg via INTRAVENOUS
  Filled 2018-09-13: qty 1

## 2018-09-13 MED ORDER — ONDANSETRON HCL 4 MG/2ML IJ SOLN
4.0000 mg | Freq: Once | INTRAMUSCULAR | Status: AC
  Administered 2018-09-13: 4 mg via INTRAVENOUS
  Filled 2018-09-13: qty 2

## 2018-09-13 MED ORDER — MORPHINE SULFATE (PF) 4 MG/ML IV SOLN
4.0000 mg | Freq: Once | INTRAVENOUS | Status: AC
  Administered 2018-09-13: 15:00:00 4 mg via INTRAVENOUS
  Filled 2018-09-13: qty 1

## 2018-09-13 NOTE — Telephone Encounter (Signed)
Chart reviewed, diagnosed with ureteral stone.  Will cc PCP.

## 2018-09-13 NOTE — ED Notes (Signed)
Patient of unit to ct.  

## 2018-09-13 NOTE — ED Triage Notes (Signed)
Pt in via POV, reports sudden onset left flank pain and dysuria x one day.  Reports hx of kidney stones.  Ambulatory to triage.

## 2018-09-13 NOTE — ED Notes (Signed)
Patient back from CT renal.

## 2018-09-13 NOTE — Telephone Encounter (Signed)
Patient left a voicemail stating that she may have a UTI, Patient stated that she started with symptoms about an hour ago. Patient stated that she was driving home from work and started with severe left side pain in her back. Patient stated that she has a history of kidney stones. See chart, it appears patient is at the ER now.

## 2018-09-13 NOTE — ED Provider Notes (Signed)
[facility] Emergency Department Provider Note   ____________________________________________    I have reviewed the triage vital signs and the nursing notes.   HISTORY  Chief Complaint Flank Pain     HPI Brianna Weaver is a 58 y.o. female who presents with complaints of flank pain.  Patient describes left-sided flank pain which started relatively abruptly today.  She also describes urinary urgency.  She denies fevers or chills.  She describes sharp at times severe left flank pain with some radiation into her abdomen.  She does have a history of kidney stones but not in 12 or more years.  She does not take anything for this.  Past Medical History:  Diagnosis Date  . Allergy   . Asthma     Patient Active Problem List   Diagnosis Date Noted  . HLD (hyperlipidemia) 07/29/2017  . Essential hypertension 05/19/2016  . Mild persistent asthma without complication 05/19/2016    Past Surgical History:  Procedure Laterality Date  . ABDOMINAL HYSTERECTOMY  2001   partial  . NASAL SINUS SURGERY  2016    Prior to Admission medications   Medication Sig Start Date End Date Taking? Authorizing Provider  acetaminophen (TYLENOL) 325 MG tablet Take 650 mg by mouth daily.    [provider]  atorvastatin (LIPITOR) 10 MG tablet Take 1 tablet (10 mg total) by mouth daily. MUST SCHEDULE ANNUAL PHYSICAL FOR REFILLS 07/05/18   Lorre MunroeBaity, Regina W, NP  DULERA 200-5 MCG/ACT AERO INHALE 1 PUFF INTO THE LUNGS 2 (TWO) TIMES DAILY. **USE REGULARLY AND RINSE MOUTH AFTER USE** 03/10/18   Lorre MunroeBaity, Regina W, NP  HYDROcodone-acetaminophen (NORCO/VICODIN) 5-325 MG tablet Take 1 tablet by mouth every 4 (four) hours as needed for moderate pain. 09/13/18   Jene EveryKinner, Cary Wilford, MD  losartan (COZAAR) 25 MG tablet Take 1 tablet (25 mg total) by mouth daily. 08/06/17   Lorre MunroeBaity, Regina W, NP  naproxen (NAPROSYN) 500 MG tablet Take 1 tablet (500 mg total) by mouth 2 (two) times daily with a meal.  09/13/18   Jene EveryKinner, Lannah Koike, MD  ondansetron (ZOFRAN ODT) 4 MG disintegrating tablet Take 1 tablet (4 mg total) by mouth every 8 (eight) hours as needed. 09/13/18   Jene EveryKinner, Dominick Zertuche, MD  Vitamin D, Ergocalciferol, (DRISDOL) 50000 units CAPS capsule Take 1 capsule (50,000 Units total) by mouth every 7 (seven) days. 07/28/17   Lorre MunroeBaity, Regina W, NP     Allergies Aspirin and Penicillins  Family History  Problem Relation Age of Onset  . Arthritis Mother   . Hypertension Mother   . Breast cancer Sister [id number]  . Arthritis Paternal Grandmother   . Breast cancer Paternal Grandmother [phone]  . Hypertension Paternal Grandmother     Social History Social History   Tobacco Use  . Smoking status: Never Smoker  . Smokeless tobacco: Never Used  Substance Use Topics  . Alcohol use: Yes    Comment: rare  . Drug use: No    Review of Systems  Constitutional: No fever/chills Eyes: No visual changes.  ENT: No sore throat. Cardiovascular: Denies chest pain. Respiratory: Denies shortness of breath. Gastrointestinal: No abdominal pain  Genitourinary: Negative for dysuria. Musculoskeletal: Negative for back pain. Skin: Negative for rash. Neurological: Negative for headaches   ____________________________________________   PHYSICAL EXAM:  VITAL SIGNS: ED Triage Vitals  Enc Vitals Group     BP 09/13/18 1407 (!) 135/100     Pulse Rate 09/13/18 1407 76     Resp 09/13/18 1407 20  Temp 09/13/18 1407 97.8 F (36.6 C)     Temp Source 09/13/18 1407 Oral     SpO2 09/13/18 1407 96 %     Weight 09/13/18 1408 90.3 kg (199 lb)     Height 09/13/18 1408 1.626 m (5\' 4" )     Head Circumference --      Peak Flow --      Pain Score 09/13/18 1408 8     Pain Loc --      Pain Edu? --      Excl. in GC? --     Constitutional: Alert and oriented.  Eyes: Conjunctivae are normal.   Nose: No congestion/rhinnorhea. Mouth/Throat: Mucous membranes are moist.    Cardiovascular: Normal rate, regular rhythm. Grossly  normal heart sounds.  Good peripheral circulation. Respiratory: Normal respiratory effort.  No retractions. Lungs CTAB. Gastrointestinal: Soft and nontender. No distention.    Musculoskeletal: Warm and well perfused Neurologic:  Normal speech and language. No gross focal neurologic deficits are appreciated.  Skin:  Skin is warm, dry and intact. No rash noted. Psychiatric: Mood and affect are normal. Speech and behavior are normal.  ____________________________________________   LABS (all labs ordered are listed, but only abnormal results are displayed)  Labs Reviewed  CBC WITH DIFFERENTIAL/PLATELET - Abnormal; Notable for the following components:      Result Value   RBC 5.49 (*)    Hemoglobin 15.3 (*)    HCT 48.7 (*)    All other components within normal limits  URINE CULTURE  COMPREHENSIVE METABOLIC PANEL  LIPASE, BLOOD  URINALYSIS, COMPLETE (UACMP) WITH MICROSCOPIC   ____________________________________________  EKG   ____________________________________________  RADIOLOGY  CT renal stone study ____________________________________________   PROCEDURES  Procedure(s) performed: No  Procedures   Critical Care performed: No ____________________________________________   INITIAL IMPRESSION / ASSESSMENT AND PLAN / ED COURSE  Pertinent labs & imaging results that were available during my care of the patient were reviewed by me and considered in my medical decision making (see chart for details).  Patient presents with complaints of left flank pain relatively abrupt onset also with urinary urgency.  Differential includes ureterolithiasis, urinary tract infection, diverticulitis.  Will treat with IV morphine, IV Zofran, obtain labs, urine, CT renal stone study and reevaluate.  CT scan demonstrates 2 to 3 mm left UVJ stone which is likely the source of her pain.  Pending urinalysis.  Will give additional dose of IV morphine and IV fluids     ____________________________________________   FINAL CLINICAL IMPRESSION(S) / ED DIAGNOSES  Final diagnoses:  Ureterolithiasis        Note:  This document was prepared using Dragon voice recognition software and may include unintentional dictation errors.   Jene Every, MD 09/13/18 [phone]

## 2018-09-13 NOTE — Discharge Instructions (Addendum)
Your CT scan shows a small kidney stone just outside your bladder on the left side.  This should pass into the bladder which will allow your symptoms to dramatically improved.  There is no sign of any urinary tract infection or kidney dysfunction on your lab work today.  We have sent prescriptions to your pharmacy to help control the pain and nausea until your symptoms resolved.

## 2018-09-14 LAB — URINE CULTURE

## 2018-09-14 NOTE — Telephone Encounter (Signed)
Set up for virtual ER followup.

## 2018-09-14 NOTE — Telephone Encounter (Signed)
I spoke with pt, she will call back to schedule appt for ER follow up if the pain returns. She said she is feeling better now.

## 2018-11-17 ENCOUNTER — Other Ambulatory Visit: Payer: Self-pay | Admitting: Internal Medicine

## 2018-11-17 ENCOUNTER — Encounter: Payer: Self-pay | Admitting: Internal Medicine

## 2018-11-17 MED ORDER — ATORVASTATIN CALCIUM 10 MG PO TABS: 10.0000 mg | ORAL_TABLET | Freq: Every day | ORAL | 0 refills | Status: DC

## 2018-11-17 MED ORDER — DULERA 200-5 MCG/ACT IN AERO: 1.0000 | INHALATION_SPRAY | Freq: Two times a day (BID) | RESPIRATORY_TRACT | 0 refills | Status: DC

## 2018-12-07 ENCOUNTER — Encounter: Payer: BLUE CROSS/BLUE SHIELD | Admitting: Internal Medicine

## 2018-12-09 ENCOUNTER — Other Ambulatory Visit: Payer: Self-pay | Admitting: Internal Medicine

## 2018-12-10 ENCOUNTER — Other Ambulatory Visit: Payer: Self-pay | Admitting: Internal Medicine

## 2018-12-23 ENCOUNTER — Ambulatory Visit (INDEPENDENT_AMBULATORY_CARE_PROVIDER_SITE_OTHER): Payer: BC Managed Care – PPO | Admitting: Internal Medicine

## 2018-12-23 ENCOUNTER — Encounter: Payer: Self-pay | Admitting: Internal Medicine

## 2018-12-23 VITALS — BP 117/77 | Wt 197.0 lb

## 2018-12-23 DIAGNOSIS — E7800 Pure hypercholesterolemia, unspecified: Secondary | ICD-10-CM | POA: Diagnosis not present

## 2018-12-23 DIAGNOSIS — Z0000 Encounter for general adult medical examination without abnormal findings: Secondary | ICD-10-CM | POA: Diagnosis not present

## 2018-12-23 DIAGNOSIS — I10 Essential (primary) hypertension: Secondary | ICD-10-CM | POA: Diagnosis not present

## 2018-12-23 DIAGNOSIS — J4530 Mild persistent asthma, uncomplicated: Secondary | ICD-10-CM

## 2018-12-23 MED ORDER — BUDESONIDE-FORMOTEROL FUMARATE 80-4.5 MCG/ACT IN AERO: 2.0000 | INHALATION_SPRAY | Freq: Two times a day (BID) | RESPIRATORY_TRACT | 3 refills | Status: DC

## 2018-12-23 NOTE — Assessment & Plan Note (Signed)
Will trial Symbicort in place of [facility]

## 2018-12-23 NOTE — Assessment & Plan Note (Signed)
Will have her schedule lab only appt for CMET and Lipid profile  Encouraged her to consume a low fat diet Continue Atorvastatin for now

## 2018-12-23 NOTE — Assessment & Plan Note (Signed)
Lost 80 lb! Reinforced DASH diet exercise Off Losartan Will have her schedule a lab only appt for CMET, nurse visit for BP check

## 2018-12-23 NOTE — Patient Instructions (Signed)

## 2018-12-23 NOTE — Progress Notes (Signed)
Virtual Visit via Video Note  I connected with Brianna Weaver on 12/23/18 at  3:15 PM EDT by a video enabled telemedicine application and verified that I am speaking with the correct person using two identifiers.  Location: Patient: In her personal vehicle Provider: Office   I discussed the limitations of evaluation and management by telemedicine and the availability of in person appointments. The patient expressed understanding and agreed to proceed.  History of Present Illness:  Pt due for her annual exam.  She is also due to follow up chronic conditions.  HTN: Her BP today is 117/77. She has lost 80 lbs in the last 8 months. She stopped taking her Losartan due to weight loss.   Asthma: She denies chronic cough or SOB. Symptoms controlled on Dulera, but insurance will no longer pay for this. She is requesting an alternative. There are no PFT's on file.   HLD: Her last LDL was 60, 07/2017. She denies myalgias on Atorvastatin. She consumes a low fat diet.  Flu: 07/2017 Tetanus: 05/2016 Pneumovax: 05/2016 Pap Smear: partial hysterectomy, 15 years ago Mammogram: 06/2018 Colon Screening: Cologuard 06/2016 Vision Screening: as needed Dentist: Biannually  Diet: She does eat meat. She consumes fruits and veggies daily. She tries to avoid fried foods. She drinks mostly water, caffeine free Dt. Pepsi. Exercise: 1 hour video, 3 x week, 10000 steps 5 days per week.   Past Medical History:  Diagnosis Date  . Allergy   . Asthma     Current Outpatient Medications  Medication Sig Dispense Refill  . atorvastatin (LIPITOR) 10 MG tablet Take 1 tablet (10 mg total) by mouth daily. 30 tablet 0  . diphenhydramine-acetaminophen (TYLENOL PM) 25-500 MG TABS tablet Take 1-2 tablets by mouth at bedtime as needed (sleep/pain).    . DULERA 200-5 MCG/ACT AERO INHALE 1 PUFF INTO THE LUNGS 2 (TWO) TIMES DAILY. **USE REGULARLY AND RINSE MOUTH AFTER USE** 13 g 2   No current facility-administered medications  for this visit.     Allergies  Allergen Reactions  . Aspirin Shortness Of Breath  . Penicillins Shortness Of Breath    Family History  Problem Relation Age of Onset  . Arthritis Mother   . Hypertension Mother   . Breast cancer Sister 38  . Arthritis Paternal Grandmother   . Breast cancer Paternal Grandmother 97  . Hypertension Paternal Grandmother     Social History   Socioeconomic History  . Marital status: Married    Spouse name: Not on file  . Number of children: Not on file  . Years of education: Not on file  . Highest education level: Not on file  Occupational History  . Not on file  Social Needs  . Financial resource strain: Not on file  . Food insecurity    Worry: Not on file    Inability: Not on file  . Transportation needs    Medical: Not on file    Non-medical: Not on file  Tobacco Use  . Smoking status: Never Smoker  . Smokeless tobacco: Never Used  Substance and Sexual Activity  . Alcohol use: Yes    Comment: rare  . Drug use: No  . Sexual activity: Yes  Lifestyle  . Physical activity    Days per week: Not on file    Minutes per session: Not on file  . Stress: Not on file  Relationships  . Social Herbalist on phone: Not on file    Gets together: Not on  file    Attends religious service: Not on file    Active member of club or organization: Not on file    Attends meetings of clubs or organizations: Not on file    Relationship status: Not on file  . Intimate partner violence    Fear of current or ex partner: Not on file    Emotionally abused: Not on file    Physically abused: Not on file    Forced sexual activity: Not on file  Other Topics Concern  . Not on file  Social History Narrative  . Not on file     Constitutional: Denies fever, malaise, fatigue, headache or abrupt weight changes.  HEENT: Denies eye pain, eye redness, ear pain, ringing in the ears, wax buildup, runny nose, nasal congestion, bloody nose, or sore  throat. Respiratory: Denies difficulty breathing, shortness of breath, cough or sputum production.   Cardiovascular: Denies chest pain, chest tightness, palpitations or swelling in the hands or feet.  Gastrointestinal: Denies abdominal pain, bloating, constipation, diarrhea or blood in the stool.  GU: Denies urgency, frequency, pain with urination, burning sensation, blood in urine, odor or discharge. Musculoskeletal: Denies decrease in range of motion, difficulty with gait, muscle pain or joint pain and swelling.  Skin: Denies redness, rashes, lesions or ulcercations.  Neurological: Denies dizziness, difficulty with memory, difficulty with speech or problems with balance and coordination.  Psych: Denies anxiety, depression, SI/HI.  No other specific complaints in a complete review of systems (except as listed in HPI above).  Observations/Objective:  BP 117/77   Wt 197 lb (89.4 kg)   BMI 33.81 kg/m  Wt Readings from Last 3 Encounters:  12/23/18 197 lb (89.4 kg)  09/13/18 199 lb (90.3 kg)  07/23/17 282 lb (127.9 kg)    General: Appears her stated age, obese, in NAD. Pulmonary/Chest: Normal effort. No respiratory distress.  Neurological: Alert and oriented.  Psychiatric: Mood and affect normal. Behavior is normal. Judgment and thought content normal.     BMET    Component Value Date/Time   NA 138 09/13/2018 1424   K 4.1 09/13/2018 1424   CL 102 09/13/2018 1424   CO2 26 09/13/2018 1424   GLUCOSE 113 (H) 09/13/2018 1424   BUN 16 09/13/2018 1424   CREATININE 0.90 09/13/2018 1424   CALCIUM 9.7 09/13/2018 1424   GFRNONAA >60 09/13/2018 1424   GFRAA >60 09/13/2018 1424    Lipid Panel     Component Value Date/Time   CHOL 124 07/23/2017 1601   TRIG 79.0 07/23/2017 1601   HDL 48.40 07/23/2017 1601   CHOLHDL 3 07/23/2017 1601   VLDL 15.8 07/23/2017 1601   LDLCALC 60 07/23/2017 1601    CBC    Component Value Date/Time   WBC 8.7 09/13/2018 1424   RBC 5.49 (H) 09/13/2018  1424   HGB 15.3 (H) 09/13/2018 1424   HCT 48.7 (H) 09/13/2018 1424   PLT 228 09/13/2018 1424   MCV 88.7 09/13/2018 1424   MCH 27.9 09/13/2018 1424   MCHC 31.4 09/13/2018 1424   RDW 14.5 09/13/2018 1424   LYMPHSABS 1.6 09/13/2018 1424   MONOABS 0.5 09/13/2018 1424   EOSABS 0.1 09/13/2018 1424   BASOSABS 0.0 09/13/2018 1424    Hgb A1C Lab Results  Component Value Date   HGBA1C 5.8 06/02/2016       Assessment and Plan:  Preventative Health Maintenance:  Encouraged her to get a flu shot in the fall Tetanus UTD She declines pelvic exam  Mammogram UTD  Colon screening UTD Encouraged her to consume a balanced diet and exercise regimen Advised her to see an eye doctor and dentist annually Will have her schedule lab only appt for CBC, CMET, Lipid and Vit D  RTC in 1 year, sooner if needed Nicki Reaperegina Tarryn Bogdan, NP  Follow Up Instructions:    I discussed the assessment and treatment plan with the patient. The patient was provided an opportunity to ask questions and all were answered. The patient agreed with the plan and demonstrated an understanding of the instructions.   The patient was advised to call back or seek an in-person evaluation if the symptoms worsen or if the condition fails to improve as anticipated.    Nicki Reaperegina Macon Sandiford, NP

## 2019-01-03 ENCOUNTER — Other Ambulatory Visit: Payer: Self-pay | Admitting: Internal Medicine

## 2019-01-04 ENCOUNTER — Other Ambulatory Visit: Payer: Self-pay | Admitting: Internal Medicine

## 2019-01-04 DIAGNOSIS — Z0000 Encounter for general adult medical examination without abnormal findings: Secondary | ICD-10-CM

## 2019-01-13 ENCOUNTER — Ambulatory Visit: Payer: BC Managed Care – PPO

## 2019-01-13 ENCOUNTER — Other Ambulatory Visit: Payer: BC Managed Care – PPO

## 2019-01-13 ENCOUNTER — Other Ambulatory Visit (INDEPENDENT_AMBULATORY_CARE_PROVIDER_SITE_OTHER): Payer: BC Managed Care – PPO

## 2019-01-13 ENCOUNTER — Other Ambulatory Visit: Payer: Self-pay

## 2019-01-13 VITALS — BP 114/76 | HR 68 | Temp 97.3°F | Wt 212.0 lb

## 2019-01-13 DIAGNOSIS — Z0000 Encounter for general adult medical examination without abnormal findings: Secondary | ICD-10-CM

## 2019-01-13 DIAGNOSIS — I10 Essential (primary) hypertension: Secondary | ICD-10-CM

## 2019-01-13 LAB — COMPREHENSIVE METABOLIC PANEL
ALT: 17 U/L (ref 0–35)
AST: 14 U/L (ref 0–37)
Albumin: 3.9 g/dL (ref 3.5–5.2)
Alkaline Phosphatase: 88 U/L (ref 39–117)
BUN: 18 mg/dL (ref 6–23)
CO2: 29 mEq/L (ref 19–32)
Calcium: 9.3 mg/dL (ref 8.4–10.5)
Chloride: 103 mEq/L (ref 96–112)
Creatinine, Ser: 0.72 mg/dL (ref 0.40–1.20)
GFR: 83.54 mL/min (ref >60.00)
Glucose, Bld: 98 mg/dL (ref 70–99)
Potassium: 3.9 mEq/L (ref 3.5–5.1)
Sodium: 139 mEq/L (ref 135–145)
Total Bilirubin: 0.8 mg/dL (ref 0.2–1.2)
Total Protein: 7.8 g/dL (ref 6.0–8.3)

## 2019-01-13 LAB — CBC
HCT: 43 % (ref 36.0–46.0)
Hemoglobin: 14.1 g/dL (ref 12.0–15.0)
MCHC: 32.8 g/dL (ref 30.0–36.0)
MCV: 86.9 fl (ref 78.0–100.0)
Platelets: 250 10*3/uL (ref 150.0–400.0)
RBC: 4.95 Mil/uL (ref 3.87–5.11)
RDW: 13.4 % (ref 11.5–15.5)
WBC: 4.8 10*3/uL (ref 4.0–10.5)

## 2019-01-13 LAB — LIPID PANEL
Cholesterol: 167 mg/dL (ref 0–200)
HDL: 54.6 mg/dL (ref >39.00)
LDL Cholesterol: 94 mg/dL (ref 0–99)
NonHDL: 112.29
Total CHOL/HDL Ratio: 3
Triglycerides: 93 mg/dL (ref 0.0–149.0)
VLDL: 18.6 mg/dL (ref 0.0–40.0)

## 2019-01-13 LAB — VITAMIN D 25 HYDROXY (VIT D DEFICIENCY, FRACTURES): VITD: 13.81 ng/mL — ABNORMAL LOW (ref 30.00–100.00)

## 2019-01-13 NOTE — Progress Notes (Signed)
Pt came in for vitals. Please sign note under nurse schedule. Thanks

## 2019-01-14 ENCOUNTER — Encounter: Payer: Self-pay | Admitting: Internal Medicine

## 2019-01-14 ENCOUNTER — Other Ambulatory Visit: Payer: Self-pay | Admitting: Internal Medicine

## 2019-01-14 DIAGNOSIS — E559 Vitamin D deficiency, unspecified: Secondary | ICD-10-CM

## 2019-01-14 MED ORDER — VITAMIN D (ERGOCALCIFEROL) 1.25 MG (50000 UNIT) PO CAPS: 50000.0000 [IU] | ORAL_CAPSULE | ORAL | 0 refills | Status: AC

## 2019-02-06 ENCOUNTER — Encounter: Payer: Self-pay | Admitting: Internal Medicine

## 2019-02-07 MED ORDER — ATORVASTATIN CALCIUM 10 MG PO TABS: 10.0000 mg | ORAL_TABLET | Freq: Every day | ORAL | 2 refills | Status: DC

## 2019-03-18 ENCOUNTER — Other Ambulatory Visit: Payer: Self-pay | Admitting: Internal Medicine

## 2019-04-30 ENCOUNTER — Other Ambulatory Visit: Payer: Self-pay | Admitting: Internal Medicine

## 2019-05-23 ENCOUNTER — Other Ambulatory Visit: Payer: Self-pay | Admitting: Internal Medicine

## 2019-05-23 DIAGNOSIS — Z1231 Encounter for screening mammogram for malignant neoplasm of breast: Secondary | ICD-10-CM

## 2019-07-07 ENCOUNTER — Ambulatory Visit
Admission: RE | Admit: 2019-07-07 | Discharge: 2019-07-07 | Disposition: A | Discharge status: Home / Self Care | Payer: BC Managed Care – PPO | Source: Ambulatory Visit | Attending: Internal Medicine | Admitting: Internal Medicine

## 2019-07-07 DIAGNOSIS — Z1231 Encounter for screening mammogram for malignant neoplasm of breast: Secondary | ICD-10-CM | POA: Insufficient documentation

## 2019-08-07 ENCOUNTER — Ambulatory Visit: Payer: BC Managed Care – PPO | Attending: Internal Medicine

## 2019-08-07 DIAGNOSIS — Z23 Encounter for immunization: Secondary | ICD-10-CM

## 2019-08-07 NOTE — Progress Notes (Signed)
   Covid-19 Vaccination Clinic  Name:  Brianna Weaver    MRN: [id number] DOB: Feb 06, 1961  08/07/2019  [name] was observed post Covid-19 immunization for 15 minutes without incident. She was provided with Vaccine Information Sheet and instruction to access the V-Safe system.   [name] was instructed to call 911 with any severe reactions post vaccine: Marland Kitchen Difficulty breathing  . Swelling of face and throat  . A fast heartbeat  . A bad rash all over body  . Dizziness and weakness   Immunizations Administered    Name Date Dose VIS Date Route   Pfizer COVID-19 Vaccine 08/07/2019 10:30 AM 0.3 mL 04/22/2019 Intramuscular   Manufacturer: [employer], [employer]   Lot: [id number]   NDC: [address]-4

## 2019-08-28 ENCOUNTER — Other Ambulatory Visit: Payer: Self-pay | Admitting: Internal Medicine

## 2019-08-30 ENCOUNTER — Ambulatory Visit: Payer: BC Managed Care – PPO | Attending: Internal Medicine

## 2019-08-30 DIAGNOSIS — Z23 Encounter for immunization: Secondary | ICD-10-CM

## 2019-08-30 NOTE — Progress Notes (Signed)
   Covid-19 Vaccination Clinic  Name:  [name]    MRN: [id number] DOB: Feb 04, 1961  08/30/2019  [name] was observed post Covid-19 immunization for 15 minutes without incident. She was provided with Vaccine Information Sheet and instruction to access the V-Safe system.   [name] was instructed to call 911 with any severe reactions post vaccine: Marland Kitchen Difficulty breathing  . Swelling of face and throat  . A fast heartbeat  . A bad rash all over body  . Dizziness and weakness   Immunizations Administered    Name Date Dose VIS Date Route   Pfizer COVID-19 Vaccine 08/30/2019 12:42 PM 0.3 mL 07/06/2018 Intramuscular   Manufacturer: [employer], [employer]   Lot: [id number]   NDC: [address]-6

## 2019-12-01 ENCOUNTER — Other Ambulatory Visit: Payer: Self-pay | Admitting: Internal Medicine

## 2019-12-02 ENCOUNTER — Other Ambulatory Visit: Payer: Self-pay | Admitting: Internal Medicine

## 2019-12-04 ENCOUNTER — Other Ambulatory Visit: Payer: Self-pay | Admitting: Internal Medicine

## 2019-12-26 ENCOUNTER — Encounter: Payer: BC Managed Care – PPO | Admitting: Internal Medicine

## 2020-02-14 ENCOUNTER — Telehealth: Payer: Self-pay

## 2020-02-14 ENCOUNTER — Encounter: Payer: BC Managed Care – PPO | Admitting: Internal Medicine

## 2020-02-14 NOTE — Telephone Encounter (Signed)
Pt was so kind as to cancel appt today for a physical due to Covid like symptoms that started 5 days ago. She is fully vaccinated but having runny nose, loss of appetite, fatigue, headache.  She is asking if she can be rescheduled in the next few weeks and not have to wait a few months. Please call her back with a new CPE appt [phone].

## 2020-02-26 ENCOUNTER — Other Ambulatory Visit: Payer: Self-pay | Admitting: Internal Medicine

## 2020-05-03 ENCOUNTER — Encounter: Payer: BC Managed Care – PPO | Admitting: Internal Medicine

## 2020-05-08 ENCOUNTER — Other Ambulatory Visit: Payer: Self-pay

## 2020-05-08 ENCOUNTER — Emergency Department
Admission: EM | Admit: 2020-05-08 | Discharge: 2020-05-08 | Disposition: A | Discharge status: Home / Self Care | Payer: BC Managed Care – PPO | Attending: Emergency Medicine | Admitting: Emergency Medicine

## 2020-05-08 ENCOUNTER — Emergency Department: Payer: BC Managed Care – PPO

## 2020-05-08 DIAGNOSIS — Z79899 Other long term (current) drug therapy: Secondary | ICD-10-CM | POA: Insufficient documentation

## 2020-05-08 DIAGNOSIS — M25511 Pain in right shoulder: Secondary | ICD-10-CM | POA: Diagnosis not present

## 2020-05-08 DIAGNOSIS — J4520 Mild intermittent asthma, uncomplicated: Secondary | ICD-10-CM | POA: Insufficient documentation

## 2020-05-08 DIAGNOSIS — Y9301 Activity, walking, marching and hiking: Secondary | ICD-10-CM | POA: Insufficient documentation

## 2020-05-08 DIAGNOSIS — W010XXA Fall on same level from slipping, tripping and stumbling without subsequent striking against object, initial encounter: Secondary | ICD-10-CM | POA: Diagnosis not present

## 2020-05-08 DIAGNOSIS — I10 Essential (primary) hypertension: Secondary | ICD-10-CM | POA: Diagnosis not present

## 2020-05-08 DIAGNOSIS — Y92009 Unspecified place in unspecified non-institutional (private) residence as the place of occurrence of the external cause: Secondary | ICD-10-CM | POA: Diagnosis not present

## 2020-05-08 DIAGNOSIS — S42251A Displaced fracture of greater tuberosity of right humerus, initial encounter for closed fracture: Secondary | ICD-10-CM | POA: Diagnosis not present

## 2020-05-08 MED ORDER — OXYCODONE-ACETAMINOPHEN 5-325 MG PO TABS
1.0000 | ORAL_TABLET | Freq: Once | ORAL | Status: AC
  Administered 2020-05-08: 1 via ORAL
  Filled 2020-05-08: qty 1

## 2020-05-08 MED ORDER — OXYCODONE-ACETAMINOPHEN 5-325 MG PO TABS: 1.0000 | ORAL_TABLET | Freq: Four times a day (QID) | ORAL | 0 refills | Status: AC | PRN

## 2020-05-08 NOTE — ED Triage Notes (Signed)
Pt comes pov with right shoulder pain after a mechanical fall earlier today.

## 2020-05-08 NOTE — ED Provider Notes (Signed)
[facility] Emergency Department Provider Note  ____________________________________________  Time seen: Approximately 8:13 PM  I have reviewed the triage vital signs and the nursing notes.   HISTORY  Chief Complaint Shoulder Pain    HPI Brianna Weaver is a 59 y.o. female who presents the emergency department complaining of right shoulder pain.  Patient states that she and her husband were in [place] today viewing a house due to moving at the end of the month.  Patient states that as she walks into the new house, she did not see a step causing her to trip and fall landing on her right shoulder.  Patient states that she did not hit her head or lose consciousness.  She landed directly on her shoulder and believes that her arm was against her body.  Patient had immediate pain to the right shoulder and has had limited range of motion since.  She states that she tried to go to an emergency department in [place] but there was over 50 people waiting and she left to come home.  Is only complaining of shoulder pain at this time.  No history of previous injuries including fractures to the right shoulder.  Over-the-counter medication has alleviated symptoms mildly but is continuing to have pain.  No numbness or tingling of the arm.  Patient denies any elbow or wrist injury.         Past Medical History:  Diagnosis Date  . Allergy   . Asthma     Patient Active Problem List   Diagnosis Date Noted  . HLD (hyperlipidemia) 07/29/2017  . Essential hypertension 05/19/2016  . Mild persistent asthma without complication 05/19/2016    Past Surgical History:  Procedure Laterality Date  . ABDOMINAL HYSTERECTOMY  2001   partial  . NASAL SINUS SURGERY  2016    Prior to Admission medications   Medication Sig Start Date End Date Taking? Authorizing Provider  atorvastatin (LIPITOR) 10 MG tablet TAKE 1 TABLET (10 MG TOTAL) BY MOUTH DAILY AT 6 PM. 02/29/20   Baity, Salvadore Oxford, NP   oxyCODONE-acetaminophen (PERCOCET/ROXICET) 5-325 MG tablet Take 1 tablet by mouth every 6 (six) hours as needed for severe pain. 05/08/20  Yes Eames Dibiasio, Delorise Royals, PA-C  SYMBICORT 80-4.5 MCG/ACT inhaler TAKE 2 PUFFS BY MOUTH TWICE A DAY 02/29/20   Lorre Munroe, NP  diphenhydramine-acetaminophen (TYLENOL PM) 25-500 MG TABS tablet Take 1-2 tablets by mouth at bedtime as needed (sleep/pain).    [provider]  Vitamin D, Ergocalciferol, (DRISDOL) 1.25 MG (50000 UT) CAPS capsule Take 1 capsule (50,000 Units total) by mouth every 7 (seven) days. 01/14/19   Lorre Munroe, NP    Allergies Aspirin and Penicillins  Family History  Problem Relation Age of Onset  . Arthritis Mother   . Hypertension Mother   . Breast cancer Sister 49  . Arthritis Paternal Grandmother   . Breast cancer Paternal Grandmother 21  . Hypertension Paternal Grandmother     Social History Social History   Tobacco Use  . Smoking status: Never Smoker  . Smokeless tobacco: Never Used  Vaping Use  . Vaping Use: Never used  Substance Use Topics  . Alcohol use: Yes    Comment: rare  . Drug use: No     Review of Systems  Constitutional: No fever/chills Eyes: No visual changes. No discharge ENT: No upper respiratory complaints. Cardiovascular: no chest pain. Respiratory: no cough. No SOB. Gastrointestinal: No abdominal pain.  No nausea, no vomiting.  No  diarrhea.  No constipation.  Musculoskeletal: Fall with subsequent right shoulder pain Skin: Negative for rash, abrasions, lacerations, ecchymosis. Neurological: Negative for headaches, focal weakness or numbness.  10 System ROS otherwise negative.  ____________________________________________   PHYSICAL EXAM:  VITAL SIGNS: ED Triage Vitals  Enc Vitals Group     BP 05/08/20 1745 (!) 176/120     Pulse Rate 05/08/20 1745 (!) 101     Resp 05/08/20 1745 18     Temp 05/08/20 1745 98.7 F (37.1 C)     Temp Source 05/08/20 1745 Oral     SpO2  05/08/20 1745 97 %     Weight 05/08/20 1747 260 lb (117.9 kg)     Height 05/08/20 1747 5\' 4"  (1.626 m)     Head Circumference --      Peak Flow --      Pain Score 05/08/20 1747 4     Pain Loc --      Pain Edu? --      Excl. in GC? --      Constitutional: Alert and oriented. Well appearing and in no acute distress. Eyes: Conjunctivae are normal. PERRL. EOMI. Head: Atraumatic. ENT:      Ears:       Nose: No congestion/rhinnorhea.      Mouth/Throat: Mucous membranes are moist.  Neck: No stridor.  No cervical spine tenderness to palpation.  Cardiovascular: Normal rate, regular rhythm. Normal S1 and S2.  Good peripheral circulation. Respiratory: Normal respiratory effort without tachypnea or retractions. Lungs CTAB. Good air entry to the bases with no decreased or absent breath sounds. Musculoskeletal: Full range of motion to all extremities. No gross deformities appreciated.  Visualization of the right shoulder revealed no gross deformity.  No abrasions, lacerations.  Limited range of motion.  Exquisite tenderness to palpation over the superior aspect of the humerus with no palpable abnormality or deformity.  No tenderness along the clavicle or scapula.  Examination of the distal humerus, elbow, forearm and wrist is unremarkable.  Radial pulse and sensation intact distally Neurologic:  Normal speech and language. No gross focal neurologic deficits are appreciated.  Skin:  Skin is warm, dry and intact. No rash noted. Psychiatric: Mood and affect are normal. Speech and behavior are normal. Patient exhibits appropriate insight and judgement.   ____________________________________________   LABS (all labs ordered are listed, but only abnormal results are displayed)  Labs Reviewed - No data to display ____________________________________________  EKG   ____________________________________________  RADIOLOGY I personally viewed and evaluated these images as part of my medical decision  making, as well as reviewing the written report by the radiologist.  ED Provider Interpretation: I concur with mildly displaced fracture of the greater tuberosity right humerus  DG Shoulder Right  Result Date: 05/08/2020 CLINICAL DATA:  Injury RIGHT shoulder pain after mechanical fall earlier today EXAM: RIGHT SHOULDER - 2+ VIEW COMPARISON:  None FINDINGS: Mildly displaced fracture of the greater tuberosity of the RIGHT proximal humerus. Humeral head is located within the glenoid. Soft tissues are unremarkable. Acromioclavicular degenerative changes, moderate. IMPRESSION: Mildly displaced fracture of the greater tuberosity of the RIGHT proximal humerus. Electronically Signed   By: 05/10/2020 M.D.   On: 05/08/2020 18:19    ____________________________________________    PROCEDURES  Procedure(s) performed:    Procedures    Medications  oxyCODONE-acetaminophen (PERCOCET/ROXICET) 5-325 MG per tablet 1 tablet (has no administration in time range)     ____________________________________________   INITIAL IMPRESSION / ASSESSMENT AND PLAN / ED  COURSE  Pertinent labs & imaging results that were available during my care of the patient were reviewed by me and considered in my medical decision making (see chart for details).  Review of the [place] CSRS was performed in accordance of the [facility] prior to dispensing any controlled drugs.           Patient's diagnosis is consistent with fracture of the right humerus about the greater tuberosity.  Patient presented to the emergency department after mechanical fall landing on her shoulder.  Imaging revealed mildly displaced greater tuberosity fracture of the right humeral head.  No other significant findings on physical exam.  Sling administered here in the emergency department.  Patient replaced on pain medication and referred to orthopedics for further management..  Patient is given ED precautions to return to the ED for any worsening or new  symptoms.     ____________________________________________  FINAL CLINICAL IMPRESSION(S) / ED DIAGNOSES  Final diagnoses:  Closed displaced fracture of greater tuberosity of right humerus, initial encounter      NEW MEDICATIONS STARTED DURING THIS VISIT:  ED Discharge Orders         Ordered    oxyCODONE-acetaminophen (PERCOCET/ROXICET) 5-325 MG tablet  Every 6 hours PRN        05/08/20 2041              This chart was dictated using voice recognition software/Dragon. Despite best efforts to proofread, errors can occur which can change the meaning. Any change was purely unintentional.    Racheal Patches, PA-C 05/08/20 2041    Phineas Semen, MD 05/08/20 2146

## 2020-05-08 NOTE — ED Notes (Signed)
Pt signed esignature.  D/c  inst to pt.  

## 2020-05-09 ENCOUNTER — Encounter: Payer: BC Managed Care – PPO | Admitting: Internal Medicine

## 2020-05-15 DIAGNOSIS — S42251A Displaced fracture of greater tuberosity of right humerus, initial encounter for closed fracture: Secondary | ICD-10-CM | POA: Diagnosis not present

## 2020-05-21 ENCOUNTER — Other Ambulatory Visit: Payer: Self-pay | Admitting: Internal Medicine

## 2020-06-04 DIAGNOSIS — S42251D Displaced fracture of greater tuberosity of right humerus, subsequent encounter for fracture with routine healing: Secondary | ICD-10-CM | POA: Diagnosis not present

## 2020-06-10 ENCOUNTER — Other Ambulatory Visit: Payer: Self-pay | Admitting: Internal Medicine

## 2020-06-19 ENCOUNTER — Other Ambulatory Visit: Payer: Self-pay | Admitting: Internal Medicine

## 2020-07-06 DIAGNOSIS — M7501 Adhesive capsulitis of right shoulder: Secondary | ICD-10-CM | POA: Diagnosis not present

## 2020-07-06 DIAGNOSIS — S42251D Displaced fracture of greater tuberosity of right humerus, subsequent encounter for fracture with routine healing: Secondary | ICD-10-CM | POA: Diagnosis not present

## 2020-07-14 ENCOUNTER — Other Ambulatory Visit: Payer: Self-pay | Admitting: Internal Medicine

## 2020-07-29 DIAGNOSIS — J019 Acute sinusitis, unspecified: Secondary | ICD-10-CM | POA: Diagnosis not present

## 2020-07-29 DIAGNOSIS — B9689 Other specified bacterial agents as the cause of diseases classified elsewhere: Secondary | ICD-10-CM | POA: Diagnosis not present

## 2020-08-19 ENCOUNTER — Other Ambulatory Visit: Payer: Self-pay | Admitting: Internal Medicine

## 2020-09-16 ENCOUNTER — Other Ambulatory Visit: Payer: Self-pay | Admitting: Internal Medicine

## 2020-10-18 ENCOUNTER — Other Ambulatory Visit: Payer: Self-pay | Admitting: Family

## 2020-10-28 ENCOUNTER — Other Ambulatory Visit: Payer: Self-pay | Admitting: Internal Medicine

## 2020-10-29 NOTE — Telephone Encounter (Signed)
Last office visit 12/23/2018 with R. Sampson Si for CPE.  Last refilled 06/12/20 for 10.2 g with no refills.  No TOC appointment.

## 2020-10-30 ENCOUNTER — Encounter: Payer: Self-pay | Admitting: Internal Medicine

## 2020-10-30 MED ORDER — BUDESONIDE-FORMOTEROL FUMARATE 80-4.5 MCG/ACT IN AERO: 2.0000 | INHALATION_SPRAY | Freq: Two times a day (BID) | RESPIRATORY_TRACT | 0 refills | Status: AC

## 2021-03-17 IMAGING — CR DG SHOULDER 2+V*R*
3 series · 3 of 3 positions shown · non-contrast
Comparison: None

CLINICAL DATA: Injury RIGHT shoulder pain after mechanical fall
earlier today

EXAM:
RIGHT SHOULDER - 2+ VIEW

[shoulder grashey (1 of 2)]
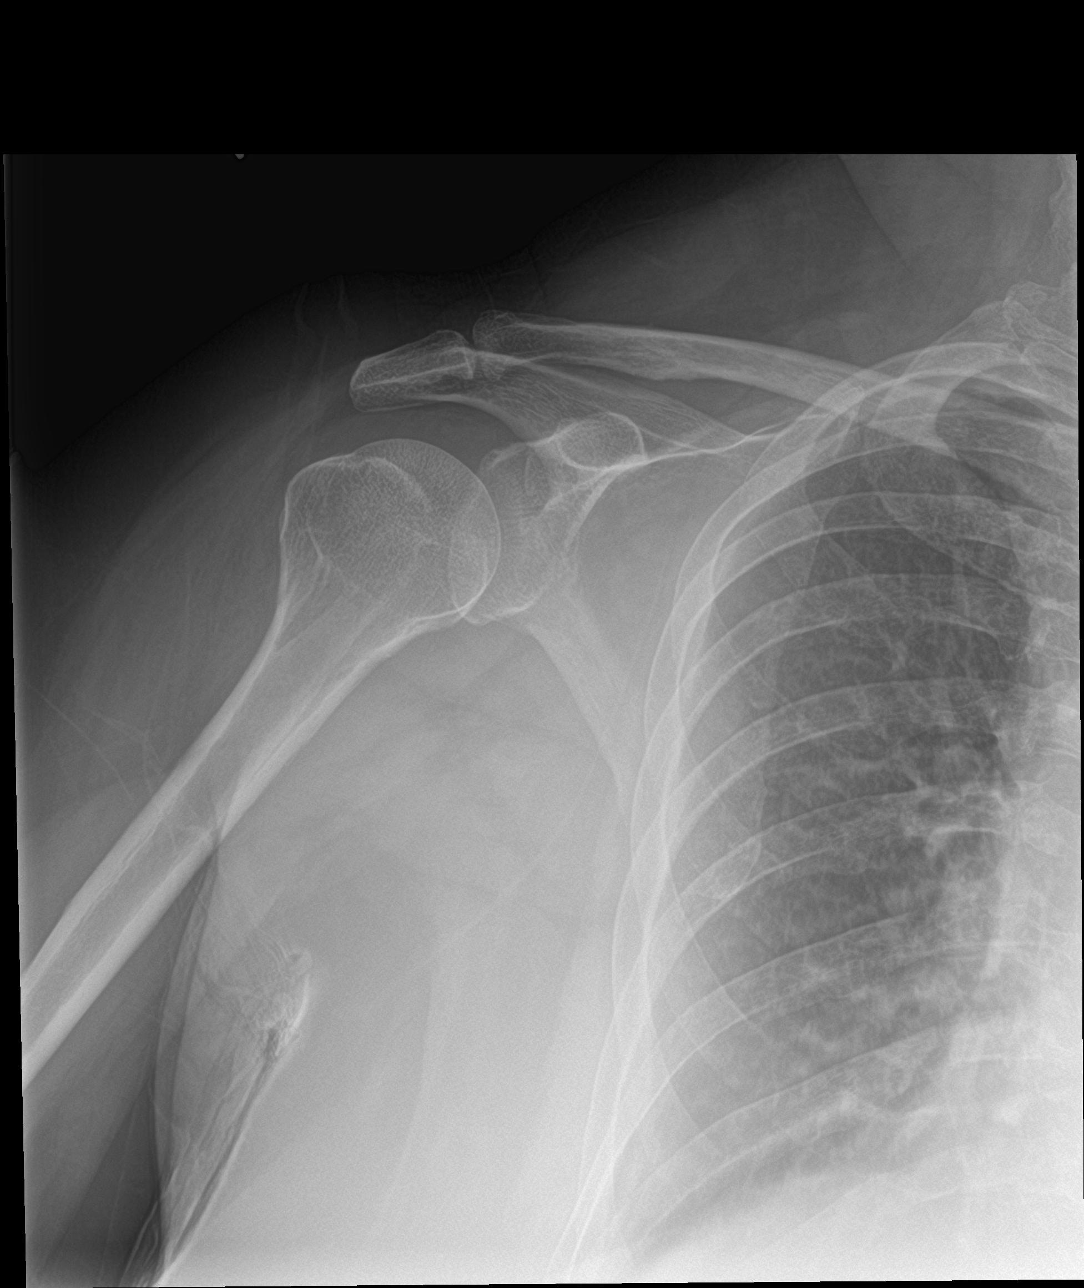

[shoulder y view]
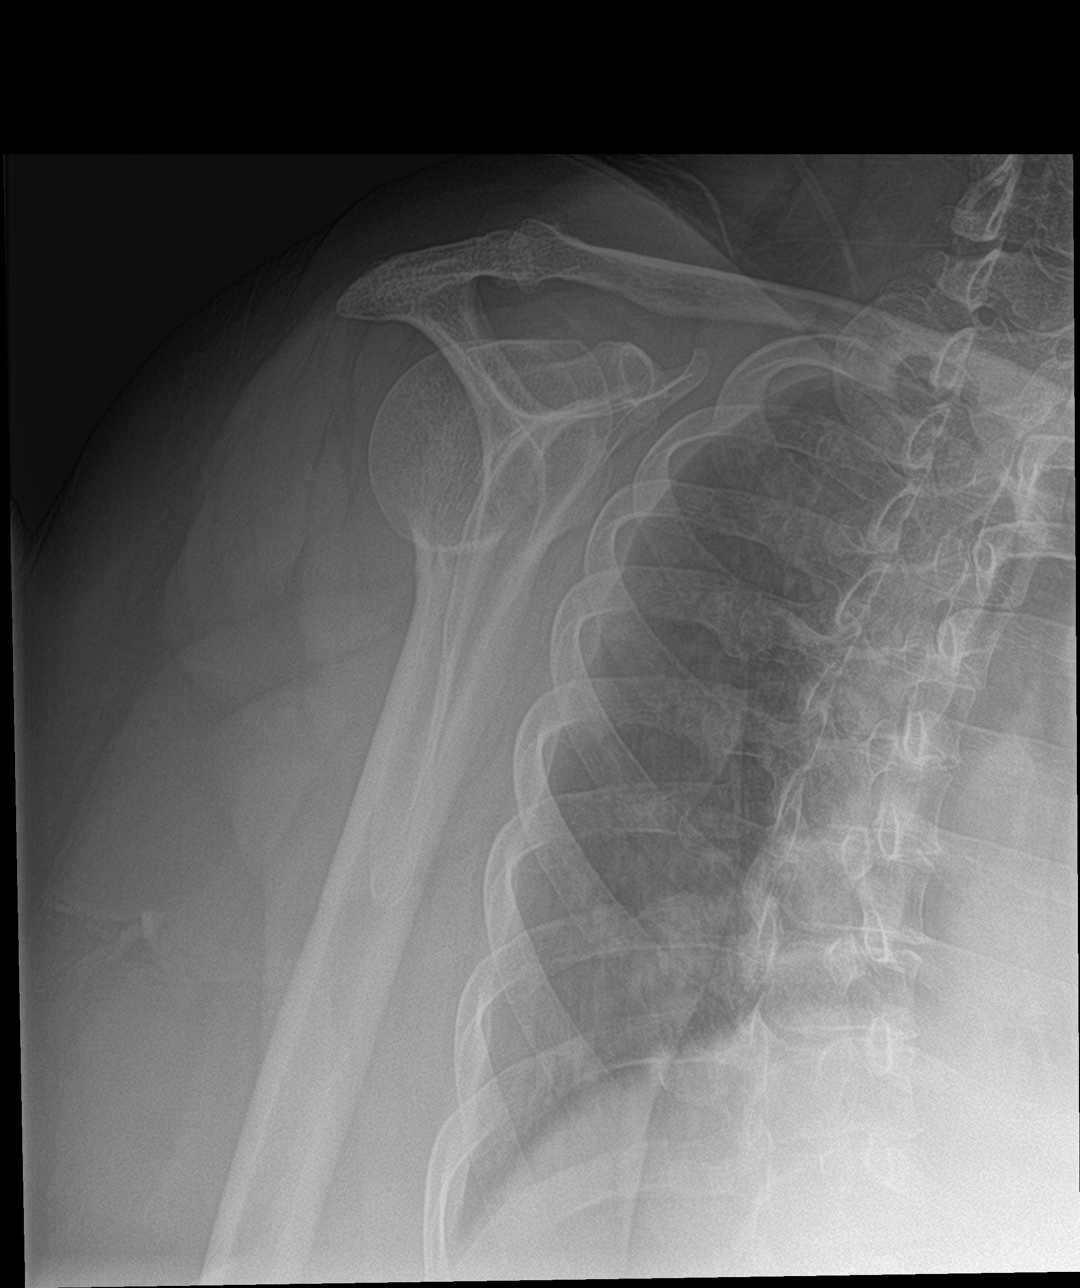

[shoulder grashey (2 of 2)]
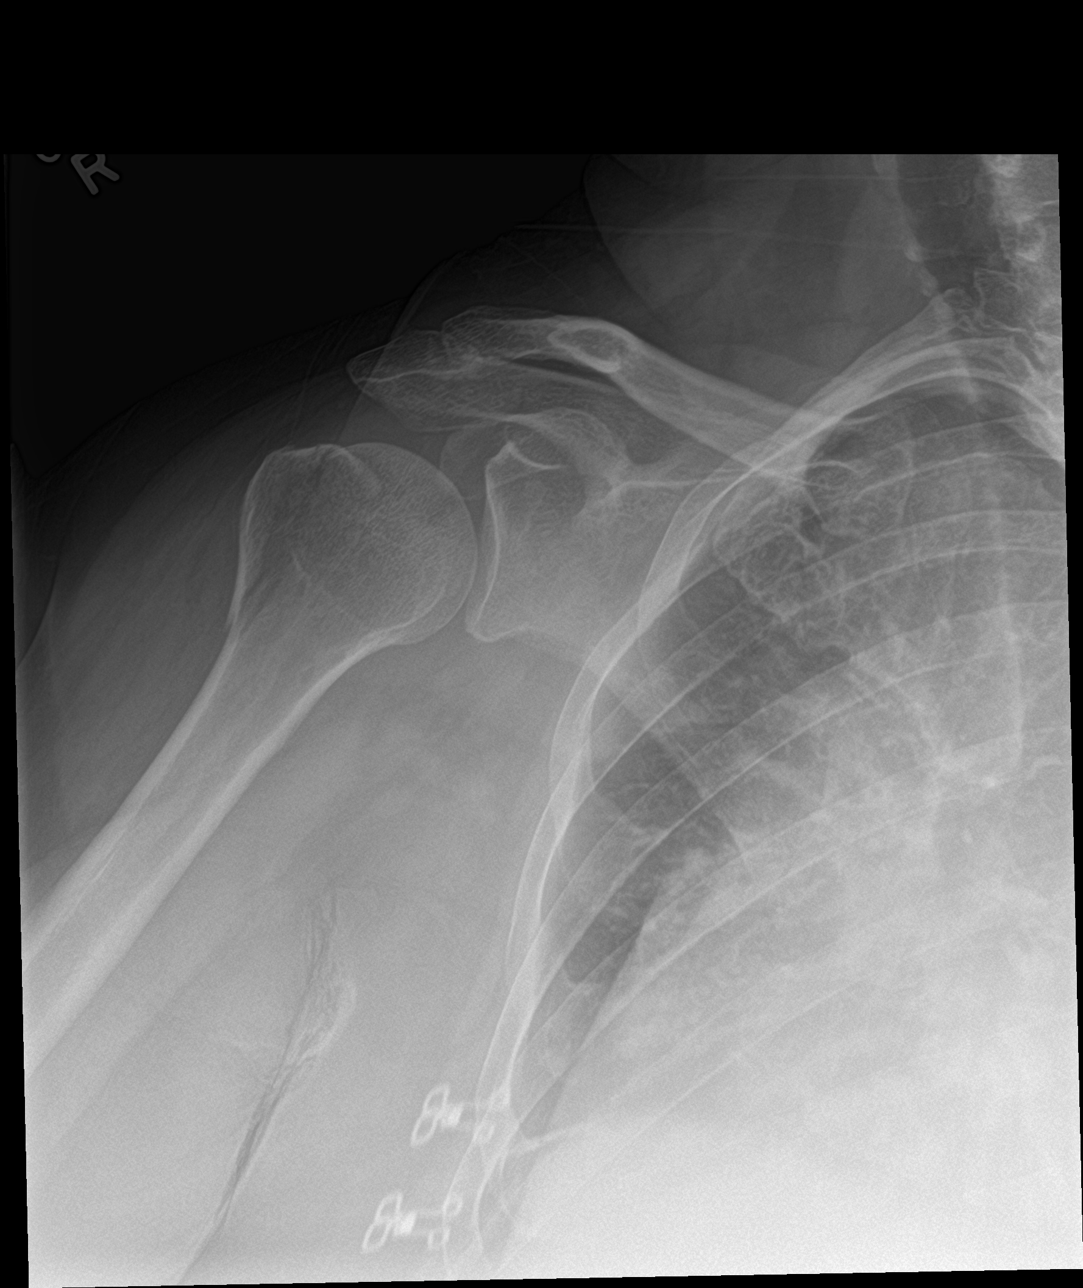

[3 of 3 positions shown; findings below may reference images not displayed]

FINDINGS: Mildly displaced fracture of the greater tuberosity of the RIGHT
proximal humerus. Humeral head is located within the glenoid. Soft
tissues are unremarkable. Acromioclavicular degenerative changes,
moderate.
IMPRESSION: Mildly displaced fracture of the greater tuberosity of the RIGHT
proximal humerus.
